# Patient Record
Sex: Male | Born: 1980 | Race: White | Hispanic: No | Marital: Single | Smoking: Current every day smoker
Health system: Southern US, Community
[De-identification: ages and names within clinical notes are randomized; demographics above are authoritative.]

---

## 2011-09-20 ENCOUNTER — Emergency Department (HOSPITAL_COMMUNITY)
Admission: EM | Admit: 2011-09-20 | Discharge: 2011-09-20 | Disposition: A | Discharge status: Home / Self Care | Payer: Self-pay | Attending: Emergency Medicine | Admitting: Emergency Medicine

## 2011-09-20 ENCOUNTER — Emergency Department (HOSPITAL_COMMUNITY): Payer: Self-pay

## 2011-09-20 DIAGNOSIS — S0230XA Fracture of orbital floor, unspecified side, initial encounter for closed fracture: Secondary | ICD-10-CM | POA: Insufficient documentation

## 2011-09-20 DIAGNOSIS — S0510XA Contusion of eyeball and orbital tissues, unspecified eye, initial encounter: Secondary | ICD-10-CM | POA: Insufficient documentation

## 2011-09-20 DIAGNOSIS — S0003XA Contusion of scalp, initial encounter: Secondary | ICD-10-CM | POA: Insufficient documentation

## 2011-09-20 DIAGNOSIS — H1130 Conjunctival hemorrhage, unspecified eye: Secondary | ICD-10-CM | POA: Insufficient documentation

## 2011-09-20 DIAGNOSIS — R51 Headache: Secondary | ICD-10-CM | POA: Insufficient documentation

## 2011-09-20 DIAGNOSIS — H5789 Other specified disorders of eye and adnexa: Secondary | ICD-10-CM | POA: Insufficient documentation

## 2011-09-20 DIAGNOSIS — H5710 Ocular pain, unspecified eye: Secondary | ICD-10-CM | POA: Insufficient documentation

## 2011-09-20 DIAGNOSIS — S02109A Fracture of base of skull, unspecified side, initial encounter for closed fracture: Secondary | ICD-10-CM | POA: Insufficient documentation

## 2011-09-20 DIAGNOSIS — S20229A Contusion of unspecified back wall of thorax, initial encounter: Secondary | ICD-10-CM | POA: Insufficient documentation

## 2011-09-20 DIAGNOSIS — IMO0002 Reserved for concepts with insufficient information to code with codable children: Secondary | ICD-10-CM | POA: Insufficient documentation
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
## 2014-05-25 ENCOUNTER — Encounter (HOSPITAL_COMMUNITY): Admission: EM | Disposition: A | Discharge status: Rehabilitation Facility | Payer: Self-pay | Source: Home / Self Care

## 2014-05-25 ENCOUNTER — Emergency Department (HOSPITAL_COMMUNITY): Payer: No Typology Code available for payment source

## 2014-05-25 ENCOUNTER — Encounter (HOSPITAL_COMMUNITY): Payer: Self-pay | Admitting: Emergency Medicine

## 2014-05-25 ENCOUNTER — Inpatient Hospital Stay (HOSPITAL_COMMUNITY): Payer: No Typology Code available for payment source

## 2014-05-25 ENCOUNTER — Inpatient Hospital Stay (HOSPITAL_COMMUNITY): Payer: No Typology Code available for payment source | Admitting: Anesthesiology

## 2014-05-25 ENCOUNTER — Encounter (HOSPITAL_COMMUNITY): Payer: No Typology Code available for payment source | Admitting: Anesthesiology

## 2014-05-25 ENCOUNTER — Inpatient Hospital Stay (HOSPITAL_COMMUNITY)
Admission: EM | Admit: 2014-05-25 | Discharge: 2014-06-01 | DRG: 464 | Disposition: A | Discharge status: Rehabilitation Facility | Payer: No Typology Code available for payment source | Attending: General Surgery | Admitting: General Surgery

## 2014-05-25 DIAGNOSIS — S060X0A Concussion without loss of consciousness, initial encounter: Secondary | ICD-10-CM | POA: Diagnosis present

## 2014-05-25 DIAGNOSIS — S01409A Unspecified open wound of unspecified cheek and temporomandibular area, initial encounter: Secondary | ICD-10-CM | POA: Diagnosis present

## 2014-05-25 DIAGNOSIS — S91009A Unspecified open wound, unspecified ankle, initial encounter: Secondary | ICD-10-CM

## 2014-05-25 DIAGNOSIS — R000 Tachycardia, unspecified: Secondary | ICD-10-CM | POA: Diagnosis not present

## 2014-05-25 DIAGNOSIS — S0530XA Ocular laceration without prolapse or loss of intraocular tissue, unspecified eye, initial encounter: Secondary | ICD-10-CM

## 2014-05-25 DIAGNOSIS — S81009A Unspecified open wound, unspecified knee, initial encounter: Secondary | ICD-10-CM | POA: Diagnosis present

## 2014-05-25 DIAGNOSIS — M949 Disorder of cartilage, unspecified: Secondary | ICD-10-CM

## 2014-05-25 DIAGNOSIS — S32409A Unspecified fracture of unspecified acetabulum, initial encounter for closed fracture: Principal | ICD-10-CM | POA: Diagnosis present

## 2014-05-25 DIAGNOSIS — S32402A Unspecified fracture of left acetabulum, initial encounter for closed fracture: Secondary | ICD-10-CM

## 2014-05-25 DIAGNOSIS — S0520XA Ocular laceration and rupture with prolapse or loss of intraocular tissue, unspecified eye, initial encounter: Secondary | ICD-10-CM | POA: Diagnosis present

## 2014-05-25 DIAGNOSIS — S060XAA Concussion with loss of consciousness status unknown, initial encounter: Secondary | ICD-10-CM

## 2014-05-25 DIAGNOSIS — S0181XA Laceration without foreign body of other part of head, initial encounter: Secondary | ICD-10-CM

## 2014-05-25 DIAGNOSIS — S81809A Unspecified open wound, unspecified lower leg, initial encounter: Secondary | ICD-10-CM

## 2014-05-25 DIAGNOSIS — F17200 Nicotine dependence, unspecified, uncomplicated: Secondary | ICD-10-CM | POA: Diagnosis present

## 2014-05-25 DIAGNOSIS — E349 Endocrine disorder, unspecified: Secondary | ICD-10-CM | POA: Diagnosis present

## 2014-05-25 DIAGNOSIS — S0121XA Laceration without foreign body of nose, initial encounter: Secondary | ICD-10-CM

## 2014-05-25 DIAGNOSIS — S42001A Fracture of unspecified part of right clavicle, initial encounter for closed fracture: Secondary | ICD-10-CM

## 2014-05-25 DIAGNOSIS — S0180XA Unspecified open wound of other part of head, initial encounter: Secondary | ICD-10-CM | POA: Diagnosis present

## 2014-05-25 DIAGNOSIS — Z1881 Retained glass fragments: Secondary | ICD-10-CM

## 2014-05-25 DIAGNOSIS — T07XXXA Unspecified multiple injuries, initial encounter: Secondary | ICD-10-CM | POA: Diagnosis present

## 2014-05-25 DIAGNOSIS — S42009A Fracture of unspecified part of unspecified clavicle, initial encounter for closed fracture: Secondary | ICD-10-CM | POA: Diagnosis present

## 2014-05-25 DIAGNOSIS — D62 Acute posthemorrhagic anemia: Secondary | ICD-10-CM | POA: Diagnosis not present

## 2014-05-25 DIAGNOSIS — Y92410 Unspecified street and highway as the place of occurrence of the external cause: Secondary | ICD-10-CM

## 2014-05-25 DIAGNOSIS — R339 Retention of urine, unspecified: Secondary | ICD-10-CM | POA: Diagnosis not present

## 2014-05-25 DIAGNOSIS — IMO0002 Reserved for concepts with insufficient information to code with codable children: Secondary | ICD-10-CM | POA: Diagnosis present

## 2014-05-25 DIAGNOSIS — M899 Disorder of bone, unspecified: Secondary | ICD-10-CM | POA: Diagnosis present

## 2014-05-25 DIAGNOSIS — S0120XA Unspecified open wound of nose, initial encounter: Secondary | ICD-10-CM | POA: Diagnosis present

## 2014-05-25 DIAGNOSIS — H2100 Hyphema, unspecified eye: Secondary | ICD-10-CM | POA: Diagnosis present

## 2014-05-25 DIAGNOSIS — S060X9A Concussion with loss of consciousness of unspecified duration, initial encounter: Secondary | ICD-10-CM

## 2014-05-25 DIAGNOSIS — S51809A Unspecified open wound of unspecified forearm, initial encounter: Secondary | ICD-10-CM | POA: Diagnosis present

## 2014-05-25 DIAGNOSIS — T2200XA Burn of unspecified degree of shoulder and upper limb, except wrist and hand, unspecified site, initial encounter: Secondary | ICD-10-CM

## 2014-05-25 DIAGNOSIS — F1210 Cannabis abuse, uncomplicated: Secondary | ICD-10-CM | POA: Diagnosis present

## 2014-05-25 HISTORY — PX: DEBRIDEMENT AND CLOSURE WOUND: SHX5614

## 2014-05-25 HISTORY — PX: RUPTURED GLOBE EXPLORATION AND REPAIR: SHX2366

## 2014-05-25 LAB — COMPREHENSIVE METABOLIC PANEL
ALBUMIN: 3.9 g/dL (ref 3.5–5.2)
ALT: 54 U/L — ABNORMAL HIGH (ref 0–53)
AST: 55 U/L — AB (ref 0–37)
Alkaline Phosphatase: 58 U/L (ref 39–117)
BILIRUBIN TOTAL: 0.3 mg/dL (ref 0.3–1.2)
BUN: 12 mg/dL (ref 6–23)
CHLORIDE: 106 meq/L (ref 96–112)
CO2: 23 mEq/L (ref 19–32)
CREATININE: 1.09 mg/dL (ref 0.50–1.35)
Calcium: 9.7 mg/dL (ref 8.4–10.5)
GFR calc Af Amer: >90 mL/min (ref >90)
GFR calc non Af Amer: 87 mL/min — ABNORMAL LOW (ref >90)
Glucose, Bld: 142 mg/dL — ABNORMAL HIGH (ref 70–99)
Potassium: 4.1 mEq/L (ref 3.7–5.3)
SODIUM: 143 meq/L (ref 137–147)
Total Protein: 7.2 g/dL (ref 6.0–8.3)

## 2014-05-25 LAB — PROTIME-INR
INR: 0.96 (ref 0.00–1.49)
Prothrombin Time: 12.6 seconds (ref 11.6–15.2)

## 2014-05-25 LAB — CBC
HCT: 46.6 % (ref 39.0–52.0)
HEMOGLOBIN: 15.8 g/dL (ref 13.0–17.0)
MCH: 31 pg (ref 26.0–34.0)
MCHC: 33.9 g/dL (ref 30.0–36.0)
MCV: 91.4 fL (ref 78.0–100.0)
PLATELETS: 306 10*3/uL (ref 150–400)
RBC: 5.1 MIL/uL (ref 4.22–5.81)
RDW: 13.2 % (ref 11.5–15.5)
WBC: 20.8 10*3/uL — ABNORMAL HIGH (ref 4.0–10.5)

## 2014-05-25 LAB — CDS SEROLOGY

## 2014-05-25 LAB — SAMPLE TO BLOOD BANK

## 2014-05-25 LAB — ETHANOL: Alcohol, Ethyl (B): <11 mg/dL (ref 0–11)

## 2014-05-25 LAB — LACTIC ACID, PLASMA: Lactic Acid, Venous: 2.6 mmol/L — ABNORMAL HIGH (ref 0.5–2.2)

## 2014-05-25 SURGERY — REPAIR, RUPTURE, GLOBE
Anesthesia: General | Site: Eye | Laterality: Right

## 2014-05-25 MED ORDER — LIDOCAINE HCL (CARDIAC) 20 MG/ML IV SOLN
INTRAVENOUS | Status: AC
  Filled 2014-05-25: qty 5

## 2014-05-25 MED ORDER — HYDROMORPHONE HCL PF 1 MG/ML IJ SOLN
INTRAMUSCULAR | Status: AC
  Filled 2014-05-25: qty 1

## 2014-05-25 MED ORDER — FENTANYL CITRATE 0.05 MG/ML IJ SOLN
INTRAMUSCULAR | Status: AC
  Filled 2014-05-25: qty 5

## 2014-05-25 MED ORDER — PROPOFOL 10 MG/ML IV BOLUS
200.0000 mg | Freq: Once | INTRAVENOUS | Status: AC
  Administered 2014-05-25: 40 mg via INTRAVENOUS
  Filled 2014-05-25: qty 20

## 2014-05-25 MED ORDER — FENTANYL CITRATE 0.05 MG/ML IJ SOLN
50.0000 ug | Freq: Once | INTRAMUSCULAR | Status: AC
  Administered 2014-05-25: 50 ug via INTRAVENOUS
  Filled 2014-05-25: qty 2

## 2014-05-25 MED ORDER — GLYCOPYRROLATE 0.2 MG/ML IJ SOLN
INTRAMUSCULAR | Status: DC | PRN
  Administered 2014-05-25: 0.6 mg via INTRAVENOUS

## 2014-05-25 MED ORDER — BACITRACIN-POLYMYXIN B 500-10000 UNIT/GM OP OINT
TOPICAL_OINTMENT | OPHTHALMIC | Status: AC
  Filled 2014-05-25: qty 3.5

## 2014-05-25 MED ORDER — ATROPINE SULFATE 1 % OP SOLN
OPHTHALMIC | Status: DC | PRN
  Administered 2014-05-25: 2 [drp] via OPHTHALMIC

## 2014-05-25 MED ORDER — HYDROMORPHONE HCL PF 1 MG/ML IJ SOLN
0.2500 mg | INTRAMUSCULAR | Status: DC | PRN
  Administered 2014-05-25 (×4): 0.5 mg via INTRAVENOUS

## 2014-05-25 MED ORDER — SODIUM CHLORIDE 0.9 % IJ SOLN
INTRAMUSCULAR | Status: AC
  Filled 2014-05-25: qty 10

## 2014-05-25 MED ORDER — BSS IO SOLN
INTRAOCULAR | Status: AC
  Filled 2014-05-25: qty 500

## 2014-05-25 MED ORDER — ONDANSETRON HCL 4 MG/2ML IJ SOLN
4.0000 mg | Freq: Once | INTRAMUSCULAR | Status: AC
  Administered 2014-05-25: 4 mg via INTRAVENOUS
  Filled 2014-05-25: qty 2

## 2014-05-25 MED ORDER — HYPROMELLOSE (GONIOSCOPIC) 2.5 % OP SOLN
OPHTHALMIC | Status: AC
  Filled 2014-05-25: qty 15

## 2014-05-25 MED ORDER — KETAMINE HCL 10 MG/ML IJ SOLN
2.0000 mg/kg | Freq: Once | INTRAMUSCULAR | Status: DC
  Filled 2014-05-25: qty 16.3

## 2014-05-25 MED ORDER — EPINEPHRINE HCL 1 MG/ML IJ SOLN
INTRAMUSCULAR | Status: AC
Start: 2014-05-25 — End: 2014-05-25
  Filled 2014-05-25: qty 1

## 2014-05-25 MED ORDER — IOHEXOL 300 MG/ML  SOLN
100.0000 mL | Freq: Once | INTRAMUSCULAR | Status: AC | PRN
  Administered 2014-05-25: 100 mL via INTRAVENOUS

## 2014-05-25 MED ORDER — IOHEXOL 300 MG/ML  SOLN: 100.0000 mL | Freq: Once | INTRAMUSCULAR | Status: AC | PRN

## 2014-05-25 MED ORDER — CEFAZOLIN SODIUM 1-5 GM-% IV SOLN
INTRAVENOUS | Status: AC
  Administered 2014-05-25: 1 g via INTRAVENOUS
  Filled 2014-05-25: qty 50

## 2014-05-25 MED ORDER — ROCURONIUM BROMIDE 100 MG/10ML IV SOLN
INTRAVENOUS | Status: DC | PRN
  Administered 2014-05-25 (×2): 10 mg via INTRAVENOUS
  Administered 2014-05-25: 50 mg via INTRAVENOUS

## 2014-05-25 MED ORDER — ONDANSETRON HCL 4 MG/2ML IJ SOLN: 4.0000 mg | Freq: Once | INTRAMUSCULAR | Status: DC | PRN

## 2014-05-25 MED ORDER — SODIUM HYALURONATE 10 MG/ML IO SOLN
INTRAOCULAR | Status: AC
  Filled 2014-05-25: qty 0.85

## 2014-05-25 MED ORDER — PHENYLEPHRINE HCL 2.5 % OP SOLN
OPHTHALMIC | Status: DC | PRN
  Administered 2014-05-25: 2 [drp] via OPHTHALMIC

## 2014-05-25 MED ORDER — LIDOCAINE HCL 4 % MT SOLN
OROMUCOSAL | Status: DC | PRN
  Administered 2014-05-25: 5 mL via TOPICAL

## 2014-05-25 MED ORDER — NEOSTIGMINE METHYLSULFATE 10 MG/10ML IV SOLN
INTRAVENOUS | Status: DC | PRN
  Administered 2014-05-25: 4 mg via INTRAVENOUS

## 2014-05-25 MED ORDER — PROPOFOL 10 MG/ML IV BOLUS
INTRAVENOUS | Status: AC
  Filled 2014-05-25: qty 20

## 2014-05-25 MED ORDER — TRIAMCINOLONE ACETONIDE 40 MG/ML IJ SUSP
INTRAMUSCULAR | Status: AC
  Filled 2014-05-25: qty 5

## 2014-05-25 MED ORDER — PHENYLEPHRINE HCL 10 MG/ML IJ SOLN
INTRAMUSCULAR | Status: DC | PRN
  Administered 2014-05-25 (×3): 80 ug via INTRAVENOUS

## 2014-05-25 MED ORDER — HYPROMELLOSE (GONIOSCOPIC) 2.5 % OP SOLN
OPHTHALMIC | Status: DC | PRN
  Administered 2014-05-25: 2 [drp] via OPHTHALMIC

## 2014-05-25 MED ORDER — ATROPINE SULFATE 1 % OP SOLN
1.0000 [drp] | OPHTHALMIC | Status: DC | PRN
  Filled 2014-05-25: qty 2

## 2014-05-25 MED ORDER — NA CHONDROIT SULF-NA HYALURON 40-30 MG/ML IO SOLN
INTRAOCULAR | Status: AC
Start: 2014-05-25 — End: 2014-05-25
  Filled 2014-05-25: qty 0.5

## 2014-05-25 MED ORDER — PROPOFOL 10 MG/ML IV BOLUS
INTRAVENOUS | Status: AC | PRN
  Administered 2014-05-25: 40 mg via INTRAVENOUS

## 2014-05-25 MED ORDER — PROPOFOL 10 MG/ML IV BOLUS
INTRAVENOUS | Status: DC | PRN
  Administered 2014-05-25: 200 mg via INTRAVENOUS

## 2014-05-25 MED ORDER — ACETAZOLAMIDE SODIUM 500 MG IJ SOLR
INTRAMUSCULAR | Status: DC | PRN
  Administered 2014-05-25: 500 mg via INTRAVENOUS

## 2014-05-25 MED ORDER — ONDANSETRON HCL 4 MG/2ML IJ SOLN
INTRAMUSCULAR | Status: DC | PRN
  Administered 2014-05-25: 4 mg via INTRAVENOUS

## 2014-05-25 MED ORDER — NEOSTIGMINE METHYLSULFATE 10 MG/10ML IV SOLN
INTRAVENOUS | Status: AC
  Filled 2014-05-25: qty 1

## 2014-05-25 MED ORDER — FENTANYL CITRATE 0.05 MG/ML IJ SOLN
100.0000 ug | Freq: Once | INTRAMUSCULAR | Status: AC
  Administered 2014-05-25: 100 ug via INTRAVENOUS
  Filled 2014-05-25: qty 2

## 2014-05-25 MED ORDER — ACETAZOLAMIDE SODIUM 500 MG IJ SOLR
INTRAMUSCULAR | Status: AC
  Filled 2014-05-25: qty 500

## 2014-05-25 MED ORDER — LIDOCAINE HCL 2 % IJ SOLN
INTRAMUSCULAR | Status: AC
  Filled 2014-05-25: qty 20

## 2014-05-25 MED ORDER — CEFAZOLIN SUBCONJUNCTIVAL INJECTION 100 MG/0.5 ML
INJECTION | SUBCONJUNCTIVAL | Status: DC | PRN
  Administered 2014-05-25: 200 mg via SUBCONJUNCTIVAL

## 2014-05-25 MED ORDER — HYDROMORPHONE HCL PF 1 MG/ML IJ SOLN
1.0000 mg | Freq: Once | INTRAMUSCULAR | Status: DC
  Filled 2014-05-25: qty 1

## 2014-05-25 MED ORDER — ROCURONIUM BROMIDE 50 MG/5ML IV SOLN
INTRAVENOUS | Status: AC
  Filled 2014-05-25: qty 1

## 2014-05-25 MED ORDER — HYDROMORPHONE HCL PF 1 MG/ML IJ SOLN
1.0000 mg | Freq: Once | INTRAMUSCULAR | Status: AC
  Administered 2014-05-25: 1 mg via INTRAVENOUS

## 2014-05-25 MED ORDER — TETRACAINE HCL 0.5 % OP SOLN
OPHTHALMIC | Status: AC
  Filled 2014-05-25: qty 2

## 2014-05-25 MED ORDER — SODIUM CHLORIDE 0.9 % IV BOLUS (SEPSIS)
1000.0000 mL | Freq: Once | INTRAVENOUS | Status: AC
  Administered 2014-05-25: 1000 mL via INTRAVENOUS

## 2014-05-25 MED ORDER — TETANUS-DIPHTH-ACELL PERTUSSIS 5-2.5-18.5 LF-MCG/0.5 IM SUSP
0.5000 mL | Freq: Once | INTRAMUSCULAR | Status: AC
  Administered 2014-05-25: 0.5 mL via INTRAMUSCULAR
  Filled 2014-05-25: qty 0.5

## 2014-05-25 MED ORDER — DEXAMETHASONE SODIUM PHOSPHATE 10 MG/ML IJ SOLN
INTRAMUSCULAR | Status: DC | PRN
  Administered 2014-05-25: 10 mg

## 2014-05-25 MED ORDER — CEFAZOLIN SUBCONJUNCTIVAL INJECTION 100 MG/0.5 ML
200.0000 mg | INJECTION | SUBCONJUNCTIVAL | Status: AC
  Filled 2014-05-25: qty 1

## 2014-05-25 MED ORDER — CEFAZOLIN SODIUM-DEXTROSE 2-3 GM-% IV SOLR
2.0000 g | Freq: Once | INTRAVENOUS | Status: AC
  Administered 2014-05-25: 2 g via INTRAVENOUS
  Filled 2014-05-25: qty 50

## 2014-05-25 MED ORDER — PHENYLEPHRINE HCL 2.5 % OP SOLN
1.0000 [drp] | OPHTHALMIC | Status: DC | PRN
Start: 2014-05-25 — End: 2014-05-25
  Filled 2014-05-25: qty 2

## 2014-05-25 MED ORDER — HYDROMORPHONE HCL PF 1 MG/ML IJ SOLN
1.0000 mg | Freq: Once | INTRAMUSCULAR | Status: AC
  Administered 2014-05-25: 1 mg via INTRAVENOUS
  Filled 2014-05-25: qty 1

## 2014-05-25 MED ORDER — ONDANSETRON HCL 4 MG/2ML IJ SOLN
INTRAMUSCULAR | Status: AC
  Filled 2014-05-25: qty 2

## 2014-05-25 MED ORDER — DEXAMETHASONE SODIUM PHOSPHATE 10 MG/ML IJ SOLN
INTRAMUSCULAR | Status: AC
  Filled 2014-05-25: qty 1

## 2014-05-25 MED ORDER — BSS IO SOLN
INTRAOCULAR | Status: AC
  Filled 2014-05-25: qty 15

## 2014-05-25 MED ORDER — SODIUM CHLORIDE 0.9 % IV SOLN
Freq: Once | INTRAVENOUS | Status: AC
  Administered 2014-05-25: 125 mL/h via INTRAVENOUS

## 2014-05-25 MED ORDER — BACITRACIN-POLYMYXIN B 500-10000 UNIT/GM OP OINT
TOPICAL_OINTMENT | OPHTHALMIC | Status: DC | PRN
  Administered 2014-05-25: 1 via OPHTHALMIC

## 2014-05-25 MED ORDER — BSS IO SOLN
INTRAOCULAR | Status: DC | PRN
  Administered 2014-05-25: 15 mL

## 2014-05-25 MED ORDER — FENTANYL CITRATE 0.05 MG/ML IJ SOLN
INTRAMUSCULAR | Status: AC
  Filled 2014-05-25: qty 2

## 2014-05-25 MED ORDER — FENTANYL CITRATE 0.05 MG/ML IJ SOLN
INTRAMUSCULAR | Status: DC | PRN
  Administered 2014-05-25 (×2): 100 ug via INTRAVENOUS
  Administered 2014-05-25: 50 ug via INTRAVENOUS

## 2014-05-25 MED ORDER — FENTANYL CITRATE 0.05 MG/ML IJ SOLN
100.0000 ug | Freq: Once | INTRAMUSCULAR | Status: AC
  Administered 2014-05-25: 100 ug via INTRAVENOUS

## 2014-05-25 MED ORDER — LACTATED RINGERS IV SOLN
INTRAVENOUS | Status: DC | PRN
  Administered 2014-05-25 (×2): via INTRAVENOUS

## 2014-05-25 MED ORDER — BUPIVACAINE HCL (PF) 0.75 % IJ SOLN
INTRAMUSCULAR | Status: AC
  Filled 2014-05-25: qty 10

## 2014-05-25 SURGICAL SUPPLY — 75 items
APL SRG 3 HI ABS STRL LF PLS (MISCELLANEOUS) ×2
APPLICATOR COTTON TIP 6IN STRL (MISCELLANEOUS) ×13 IMPLANT
APPLICATOR DR MATTHEWS STRL (MISCELLANEOUS) ×4 IMPLANT
BAG FLD CLT MN 6.25X3.5 (WOUND CARE) ×2
BAG MINI COLL DRAIN (WOUND CARE) ×4 IMPLANT
BLADE 10 SAFETY STRL DISP (BLADE) ×4 IMPLANT
BLADE EYE MINI 60D BEAVER (BLADE) IMPLANT
BLADE KERATOME 2.75 (BLADE) ×3 IMPLANT
BLADE KERATOME 2.75MM (BLADE) ×1
BLADE STAB KNIFE 15DEG (BLADE) IMPLANT
CANNULA ANTERIOR CHAMBER 27GA (MISCELLANEOUS) IMPLANT
CLOSURE STERI-STRIP 1/2X4 (GAUZE/BANDAGES/DRESSINGS) ×2
CLSR STERI-STRIP ANTIMIC 1/2X4 (GAUZE/BANDAGES/DRESSINGS) ×2 IMPLANT
CORDS BIPOLAR (ELECTRODE) ×3 IMPLANT
DRAPE OPHTHALMIC 77X100 STRL (CUSTOM PROCEDURE TRAY) ×4 IMPLANT
DRAPE POUCH INSTRU U-SHP 10X18 (DRAPES) ×4 IMPLANT
DRESSING TELFA 8X10 (GAUZE/BANDAGES/DRESSINGS) ×2 IMPLANT
DRSG TEGADERM 4X4.75 (GAUZE/BANDAGES/DRESSINGS) ×4 IMPLANT
DRSG TELFA 3X8 NADH (GAUZE/BANDAGES/DRESSINGS) ×8 IMPLANT
FILTER BLUE MILLIPORE (MISCELLANEOUS) IMPLANT
GLOVE SS BIOGEL STRL SZ 6.5 (GLOVE) ×2 IMPLANT
GLOVE SUPERSENSE BIOGEL SZ 6.5 (GLOVE) ×4
GOWN STRL REUS W/ TWL LRG LVL3 (GOWN DISPOSABLE) ×2 IMPLANT
GOWN STRL REUS W/ TWL XL LVL3 (GOWN DISPOSABLE) ×2 IMPLANT
GOWN STRL REUS W/TWL LRG LVL3 (GOWN DISPOSABLE) ×4
GOWN STRL REUS W/TWL XL LVL3 (GOWN DISPOSABLE) ×4
KIT BASIN OR (CUSTOM PROCEDURE TRAY) ×4 IMPLANT
KIT ROOM TURNOVER OR (KITS) IMPLANT
KNIFE GRIESHABER SHARP 2.5MM (MISCELLANEOUS) ×4 IMPLANT
MASK EYE SHIELD (GAUZE/BANDAGES/DRESSINGS) ×3 IMPLANT
NDL 18GX1X1/2 (RX/OR ONLY) (NEEDLE) IMPLANT
NDL 25GX 5/8IN NON SAFETY (NEEDLE) ×1 IMPLANT
NDL FILTER BLUNT 18X1 1/2 (NEEDLE) IMPLANT
NDL HYPO 30X.5 LL (NEEDLE) ×4 IMPLANT
NEEDLE 18GX1X1/2 (RX/OR ONLY) (NEEDLE) IMPLANT
NEEDLE 22X1 1/2 (OR ONLY) (NEEDLE) ×4 IMPLANT
NEEDLE 25GX 5/8IN NON SAFETY (NEEDLE) ×4 IMPLANT
NEEDLE FILTER BLUNT 18X 1/2SAF (NEEDLE)
NEEDLE FILTER BLUNT 18X1 1/2 (NEEDLE) IMPLANT
NEEDLE HYPO 30X.5 LL (NEEDLE) ×8 IMPLANT
NS IRRIG 1000ML POUR BTL (IV SOLUTION) ×4 IMPLANT
PACK CATARACT CUSTOM (CUSTOM PROCEDURE TRAY) ×4 IMPLANT
PACK CATARACT MCHSCP (PACKS) ×2 IMPLANT
PACK COMBINED CATERACT/VIT 23G (OPHTHALMIC RELATED) IMPLANT
PAD ARMBOARD 7.5X6 YLW CONV (MISCELLANEOUS) ×8 IMPLANT
PAD DRESSING TELFA 3X8 NADH (GAUZE/BANDAGES/DRESSINGS) IMPLANT
PAD EYE OVAL STERILE LF (GAUZE/BANDAGES/DRESSINGS) IMPLANT
PHACO TIP KELMAN 45DEG (TIP) ×4 IMPLANT
PROBE ANTERIOR 20G W/INFUS NDL (MISCELLANEOUS) IMPLANT
RING MALYGIN (MISCELLANEOUS) IMPLANT
ROLLS DENTAL (MISCELLANEOUS) IMPLANT
SHUTTLE MONARCH TYPE A (NEEDLE) ×4 IMPLANT
SOLUTION ANTI FOG 6CC (MISCELLANEOUS) ×4 IMPLANT
SPEAR EYE SURG WECK-CEL (MISCELLANEOUS) ×4 IMPLANT
SPONGE GAUZE 4X4 12PLY STER LF (GAUZE/BANDAGES/DRESSINGS) ×6 IMPLANT
SUT ETHILON 10-0 CS-B-6CS-B-6 (SUTURE) ×4
SUT ETHILON 5 0 P 3 18 (SUTURE)
SUT ETHILON 9 0 TG140 8 (SUTURE) IMPLANT
SUT NYLON ETHILON 5-0 P-3 1X18 (SUTURE) IMPLANT
SUT PLAIN 6 0 TG1408 (SUTURE) ×2 IMPLANT
SUT POLY NON ABSORB 10-0 8 STR (SUTURE) IMPLANT
SUT VICRYL 6 0 S 29 12 (SUTURE) IMPLANT
SUT VICRYL 7 0 TG140 8 (SUTURE) ×2 IMPLANT
SUTURE EHLN 10-0 CS-B-6CS-B-6 (SUTURE) ×1 IMPLANT
SYR 20CC LL (SYRINGE) IMPLANT
SYR 5ML LL (SYRINGE) IMPLANT
SYR TB 1ML LUER SLIP (SYRINGE) IMPLANT
SYRINGE 10CC LL (SYRINGE) IMPLANT
TAPE STRIPS DRAPE STRL (GAUZE/BANDAGES/DRESSINGS) ×2 IMPLANT
TAPE SURG TRANSPORE 1 IN (GAUZE/BANDAGES/DRESSINGS) IMPLANT
TAPE SURGICAL TRANSPORE 1 IN (GAUZE/BANDAGES/DRESSINGS) ×6
TOWEL OR 17X24 6PK STRL BLUE (TOWEL DISPOSABLE) ×8 IMPLANT
WATER STERILE IRR 1000ML POUR (IV SOLUTION) ×4 IMPLANT
WIPE INSTRUMENT ADHESIVE BACK (MISCELLANEOUS) ×2 IMPLANT
WIPE INSTRUMENT VISIWIPE 73X73 (MISCELLANEOUS) ×2 IMPLANT

## 2014-05-25 NOTE — ED Notes (Signed)
Dr. McLennon at the bedside.  
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
## 2014-05-25 NOTE — ED Notes (Signed)
Ortho MD at the bedside.

## 2014-05-25 NOTE — ED Notes (Signed)
Ophthalmologist at the bedside
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
## 2014-05-25 NOTE — ED Notes (Signed)
Pt returned from CT with this RN.  
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
## 2014-05-25 NOTE — Anesthesia Preprocedure Evaluation (Addendum)
Anesthesia Evaluation  Patient identified by MRN, date of birth, ID band Patient awake    Reviewed: Allergy & Precautions, H&P , NPO status , Patient's Chart, lab work & pertinent test results  Airway       Dental   Pulmonary Current Smoker,          Cardiovascular     Neuro/Psych    GI/Hepatic (+)     substance abuse  marijuana use,   Endo/Other    Renal/GU      Musculoskeletal   Abdominal   Peds  Hematology   Anesthesia Other Findings   Reproductive/Obstetrics                          Anesthesia Physical Anesthesia Plan  ASA: I and emergent  Anesthesia Plan: General   Post-op Pain Management:    Induction: Intravenous  Airway Management Planned: Oral ETT  Additional Equipment:   Intra-op Plan:   Post-operative Plan: Extubation in OR  Informed Consent: I have reviewed the patients History and Physical, chart, labs and discussed the procedure including the risks, benefits and alternatives for the proposed anesthesia with the patient or authorized representative who has indicated his/her understanding and acceptance.     Plan Discussed with:   Anesthesia Plan Comments:        Anesthesia Quick Evaluation

## 2014-05-25 NOTE — Op Note (Addendum)
  Phillip BargeBrian D [name] 05/25/2014 Ruptured Globe with prolapse of uveal tissue Hyphema  Corneal Laceration  Procedure:  Operative Eye:  right eye  Surgeon: Shade FloodGEIGER, Jabria Loos Estimated Blood Loss: minimal Specimens for Pathology:  None Complications: none  Genral endotracheal anesthesia was initiated without complication. The patient's skin injuries were examined. The patient had multiple facial lacerations, many of which were sutured in the ER. Blood was on the skin surface,  so the face and neck were cleaned with Betadine 10% solution. He was noted to have a normal pupil in both eyes, but he had clotted blood in the anterior chamber of the right eye. He was also noted to have a llamellar corneal laceration that did not penetrate the anterior chamber infero-medially. He has a chevron shaped conjunctival laceration at the nasal aspect which was associated with a scleral laceration overlying the ciliary body. The presumptive [facility] is the ciliary body injury.  The patient was prepared and draped in the usual manner for ocular surgery on the right eye. A Cook lid speculum was placed. The Wescott scissors were used to make a controlled conjunctival peritomy and expose the nasal sclera for exploration. There was modest, slow bleeding which was controlled by cautery of the sclera as well as the surface of the exposed ciliary body. No vitreous was prolapsed. The scleral wound was gradually brought back together with 7-0 Vicryl suture with successful, atraumatic reposit of the prolapsed ciliary body tissue. No retina was found exposed or in the surgical field. The globe was normal tension and no vitreous presented. After the sclera laceration was repaired, the conjunctiva was repaired with 6-0 Plain gut suture.  The llamellar corneal laceration was re-approximated with three interrupted 10-0 nylon sutures. There was no penetration into the anterior chamber. An acqueous sample was  withdrawn through the surgical limbus and placed in a pediatric blood culture bottle and submitted for culture.  A subconjunctival injection of Ancef 100 mg was placed in the temporal quadrant with the tip of the needle visualized. Dexamethansone 2mg  was also injected subconjunctivally in the inferior sub-conjunctival space with the tip of the needle visualized. Topical Bacitracin/Polymixin ointment was placed on his facial lacerations around the orbital area as well as on the ocular surface. An eye pad was placed along with a larger telfa pad so as to avoid tape adhering to the freshly sutured skin wounds.  He had multiple open skin lacerations most of which were deep. I cleaned all open wounds and closed them with steri-strips until the trauma service can finish appropriate exploration and repair. Three deep puncture wounds were on the right aspect of the neck with large linear abrasions of the chest. He has a large deep laceration down to bone on the right lower leg anteriorly and a deep left forearm laceration. All lacerations were closed with steri-strips and Telfa placed over the steri-strips  after the wounds were cleansed with Betadine. This was to clean and temporize until formal exploration and repair could be completed.  The patient was then extubated uneventfully and transferred with stable vital signs to the post-operative recovery area.  Shade FloodGEIGER, Hilarie Sinha, MD

## 2014-05-25 NOTE — Sedation Documentation (Signed)
Vital signs stable. 

## 2014-05-25 NOTE — Sedation Documentation (Signed)
Placed on NRB at 15 liters and [place] at 15 liters by Dr. Fonnie JarvisBednar.
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
## 2014-05-25 NOTE — ED Notes (Signed)
Continue to try to get scans completed. Pt not cooperating by being still. C/o left leg hurting. Repositioned multiple times. Attempting to complete scans at this time.

## 2014-05-25 NOTE — Transfer of Care (Signed)
Immediate Anesthesia Transfer of Care Note  Patient: Phillip [name]  Procedure(s) Performed: Procedure(s): REPAIR OF RUPTURED GLOBE WITH PROLAPSED UVEAL TISSUE RIGHT EYE (Right) SUPERFICIAL  CLOSURE OF WOUNDS AND DRESSING  ON RIGHT NECK, RIGHT LOWER LEG, LEFT FOREARM (Bilateral)  Patient Location: PACU  Anesthesia Type:General  Level of Consciousness: awake and sedated  Airway & Oxygen Therapy: Patient connected to face mask oxygen  Post-op Assessment: Report given to PACU RN and Post -op Vital signs reviewed and stable  Post vital signs: Reviewed and stable  Complications: No apparent anesthesia complications

## 2014-05-25 NOTE — ED Notes (Signed)
Attempted to contact pts girlfriend, [name] with no answer.
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
## 2014-05-25 NOTE — Anesthesia Procedure Notes (Signed)
Procedure Name: Intubation Date/Time: 05/25/2014 8:16 PM Performed by: Molli HazardGORDON, Tymon Nemetz M Pre-anesthesia Checklist: Patient identified, Emergency Drugs available, Suction available and Patient being monitored Patient Re-evaluated:Patient Re-evaluated prior to inductionOxygen Delivery Method: Circle system utilized Preoxygenation: Pre-oxygenation with 100% oxygen Intubation Type: IV induction and Cricoid Pressure applied Ventilation: Mask ventilation without difficulty Laryngoscope Size: Miller and 2 Grade View: Grade I Tube type: Oral Tube size: 8.0 mm Number of attempts: 1 Airway Equipment and Method: Stylet Placement Confirmation: ETT inserted through vocal cords under direct vision,  positive ETCO2 and breath sounds checked- equal and bilateral Secured at: 23 cm Tube secured with: Tape Dental Injury: Teeth and Oropharynx as per pre-operative assessment

## 2014-05-25 NOTE — Progress Notes (Signed)
Orthopedic Tech Progress Note Patient Details:  Phillip BargeBrian D [name] 1981-04-05 [id number]  Musculoskeletal Traction Type of Traction: Skeletal (Balanced Suspension) Traction Location: lle Traction Weight: 20 lbs    Nikki DomCrawford, Zhania Shaheen 05/25/2014, 7:53 PM
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
## 2014-05-25 NOTE — Sedation Documentation (Signed)
Pt moved from stretcher to hospital bed

## 2014-05-25 NOTE — OR Nursing (Signed)
Dr. Clarisa KindredGeiger cleaned the right neck wounds x 3 with betadine paint

## 2014-05-25 NOTE — ED Notes (Signed)
Pt taken to CT scan with this RN

## 2014-05-25 NOTE — Sedation Documentation (Signed)
NRB removed and O2 via [place] turned down to 3 liters.
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
## 2014-05-26 ENCOUNTER — Inpatient Hospital Stay (HOSPITAL_COMMUNITY): Payer: No Typology Code available for payment source

## 2014-05-26 ENCOUNTER — Inpatient Hospital Stay (HOSPITAL_COMMUNITY): Payer: No Typology Code available for payment source | Admitting: Anesthesiology

## 2014-05-26 ENCOUNTER — Encounter: Payer: Self-pay | Admitting: Ophthalmology

## 2014-05-26 ENCOUNTER — Encounter (HOSPITAL_COMMUNITY): Payer: Self-pay

## 2014-05-26 ENCOUNTER — Encounter (HOSPITAL_COMMUNITY): Admission: EM | Disposition: A | Discharge status: Rehabilitation Facility | Payer: Self-pay | Source: Home / Self Care

## 2014-05-26 ENCOUNTER — Encounter (HOSPITAL_COMMUNITY): Payer: No Typology Code available for payment source | Admitting: Anesthesiology

## 2014-05-26 DIAGNOSIS — S42009A Fracture of unspecified part of unspecified clavicle, initial encounter for closed fracture: Secondary | ICD-10-CM

## 2014-05-26 DIAGNOSIS — D62 Acute posthemorrhagic anemia: Secondary | ICD-10-CM

## 2014-05-26 DIAGNOSIS — S0530XA Ocular laceration without prolapse or loss of intraocular tissue, unspecified eye, initial encounter: Secondary | ICD-10-CM | POA: Diagnosis present

## 2014-05-26 HISTORY — PX: INCISION AND DRAINAGE OF WOUND: SHX1803

## 2014-05-26 HISTORY — PX: ORIF ACETABULAR FRACTURE: SHX5029

## 2014-05-26 LAB — CBC
HCT: 38.1 % — ABNORMAL LOW (ref 39.0–52.0)
HEMATOCRIT: 39.1 % (ref 39.0–52.0)
Hemoglobin: 12.8 g/dL — ABNORMAL LOW (ref 13.0–17.0)
Hemoglobin: 12.5 g/dL — ABNORMAL LOW (ref 13.0–17.0)
MCH: 30.5 pg (ref 26.0–34.0)
MCH: 30.5 pg (ref 26.0–34.0)
MCHC: 32.7 g/dL (ref 30.0–36.0)
MCHC: 32.8 g/dL (ref 30.0–36.0)
MCV: 93.1 fL (ref 78.0–100.0)
MCV: 92.9 fL (ref 78.0–100.0)
Platelets: 225 10*3/uL (ref 150–400)
Platelets: 212 10*3/uL (ref 150–400)
RBC: 4.2 MIL/uL — ABNORMAL LOW (ref 4.22–5.81)
RBC: 4.1 MIL/uL — AB (ref 4.22–5.81)
RDW: 13.6 % (ref 11.5–15.5)
RDW: 13.6 % (ref 11.5–15.5)
WBC: 11.3 10*3/uL — AB (ref 4.0–10.5)
WBC: 10.9 10*3/uL — AB (ref 4.0–10.5)

## 2014-05-26 LAB — MRSA PCR SCREENING: MRSA by PCR: NEGATIVE

## 2014-05-26 LAB — BASIC METABOLIC PANEL
BUN: 10 mg/dL (ref 6–23)
CO2: 23 mEq/L (ref 19–32)
Calcium: 8.5 mg/dL (ref 8.4–10.5)
Chloride: 104 mEq/L (ref 96–112)
Creatinine, Ser: 1.05 mg/dL (ref 0.50–1.35)
Glucose, Bld: 108 mg/dL — ABNORMAL HIGH (ref 70–99)
POTASSIUM: 4.5 meq/L (ref 3.7–5.3)
Sodium: 140 mEq/L (ref 137–147)

## 2014-05-26 LAB — LACTIC ACID, PLASMA: LACTIC ACID, VENOUS: 1.1 mmol/L (ref 0.5–2.2)

## 2014-05-26 SURGERY — OPEN REDUCTION INTERNAL FIXATION (ORIF) ACETABULAR FRACTURE
Anesthesia: General | Site: Leg Lower | Laterality: Right

## 2014-05-26 MED ORDER — PROPOFOL 10 MG/ML IV BOLUS
INTRAVENOUS | Status: DC | PRN
  Administered 2014-05-26: 180 mg via INTRAVENOUS

## 2014-05-26 MED ORDER — OXYCODONE HCL 5 MG/5ML PO SOLN: 5.0000 mg | Freq: Once | ORAL | Status: AC | PRN

## 2014-05-26 MED ORDER — GLYCOPYRROLATE 0.2 MG/ML IJ SOLN
INTRAMUSCULAR | Status: AC
  Filled 2014-05-26: qty 3

## 2014-05-26 MED ORDER — CEFAZOLIN SODIUM-DEXTROSE 2-3 GM-% IV SOLR
2.0000 g | Freq: Once | INTRAVENOUS | Status: AC
  Administered 2014-05-26: 2 g via INTRAVENOUS

## 2014-05-26 MED ORDER — ONDANSETRON HCL 4 MG/2ML IJ SOLN
INTRAMUSCULAR | Status: DC | PRN
  Administered 2014-05-26: 4 mg via INTRAVENOUS

## 2014-05-26 MED ORDER — LACTATED RINGERS IV SOLN
INTRAVENOUS | Status: DC | PRN
  Administered 2014-05-26 (×2): via INTRAVENOUS

## 2014-05-26 MED ORDER — MIDAZOLAM HCL 2 MG/2ML IJ SOLN
INTRAMUSCULAR | Status: AC
  Filled 2014-05-26: qty 2

## 2014-05-26 MED ORDER — NEOSTIGMINE METHYLSULFATE 10 MG/10ML IV SOLN
INTRAVENOUS | Status: AC
  Filled 2014-05-26: qty 1

## 2014-05-26 MED ORDER — ROCURONIUM BROMIDE 100 MG/10ML IV SOLN
INTRAVENOUS | Status: DC | PRN
  Administered 2014-05-26: 20 mg via INTRAVENOUS
  Administered 2014-05-26: 10 mg via INTRAVENOUS
  Administered 2014-05-26: 20 mg via INTRAVENOUS
  Administered 2014-05-26: 10 mg via INTRAVENOUS
  Administered 2014-05-26: 20 mg via INTRAVENOUS
  Administered 2014-05-26: 50 mg via INTRAVENOUS
  Administered 2014-05-26: 10 mg via INTRAVENOUS

## 2014-05-26 MED ORDER — LIDOCAINE HCL (CARDIAC) 20 MG/ML IV SOLN
INTRAVENOUS | Status: DC | PRN
  Administered 2014-05-26: 80 mg via INTRAVENOUS

## 2014-05-26 MED ORDER — PANTOPRAZOLE SODIUM 40 MG PO TBEC
40.0000 mg | DELAYED_RELEASE_TABLET | Freq: Every day | ORAL | Status: DC
  Administered 2014-05-27 – 2014-05-31 (×5): 40 mg via ORAL
  Filled 2014-05-26 (×5): qty 1

## 2014-05-26 MED ORDER — PROPOFOL 10 MG/ML IV BOLUS
INTRAVENOUS | Status: AC
  Filled 2014-05-26: qty 20

## 2014-05-26 MED ORDER — OXYCODONE HCL 5 MG PO TABS
5.0000 mg | ORAL_TABLET | Freq: Once | ORAL | Status: AC | PRN
  Administered 2014-05-26: 5 mg via ORAL

## 2014-05-26 MED ORDER — BACITRACIN ZINC 500 UNIT/GM EX OINT
TOPICAL_OINTMENT | Freq: Two times a day (BID) | CUTANEOUS | Status: DC
  Administered 2014-05-26 – 2014-05-27 (×2): 1 via TOPICAL
  Administered 2014-05-27 – 2014-05-28 (×3): via TOPICAL
  Administered 2014-05-29: 1 via TOPICAL
  Administered 2014-05-29 – 2014-06-01 (×6): via TOPICAL
  Filled 2014-05-26: qty 15

## 2014-05-26 MED ORDER — ONDANSETRON HCL 4 MG PO TABS
4.0000 mg | ORAL_TABLET | Freq: Four times a day (QID) | ORAL | Status: DC | PRN
Start: 2014-05-26 — End: 2014-06-01
  Administered 2014-05-29: 4 mg via ORAL

## 2014-05-26 MED ORDER — LACTATED RINGERS IV SOLN
INTRAVENOUS | Status: DC
  Administered 2014-05-26: 14:00:00 via INTRAVENOUS

## 2014-05-26 MED ORDER — HYDROMORPHONE HCL PF 1 MG/ML IJ SOLN
INTRAMUSCULAR | Status: AC
  Filled 2014-05-26: qty 1

## 2014-05-26 MED ORDER — PANTOPRAZOLE SODIUM 40 MG IV SOLR
40.0000 mg | Freq: Every day | INTRAVENOUS | Status: DC
  Administered 2014-05-26: 40 mg via INTRAVENOUS
  Filled 2014-05-26 (×3): qty 40

## 2014-05-26 MED ORDER — GLYCOPYRROLATE 0.2 MG/ML IJ SOLN
INTRAMUSCULAR | Status: DC | PRN
  Administered 2014-05-26: 0.6 mg via INTRAVENOUS

## 2014-05-26 MED ORDER — NEOSTIGMINE METHYLSULFATE 10 MG/10ML IV SOLN
INTRAVENOUS | Status: DC | PRN
  Administered 2014-05-26: 4 mg via INTRAVENOUS

## 2014-05-26 MED ORDER — WHITE PETROLATUM GEL
Status: AC
  Filled 2014-05-26: qty 5

## 2014-05-26 MED ORDER — ARTIFICIAL TEARS OP OINT
TOPICAL_OINTMENT | OPHTHALMIC | Status: DC | PRN
  Administered 2014-05-26: 1 via OPHTHALMIC

## 2014-05-26 MED ORDER — ROCURONIUM BROMIDE 50 MG/5ML IV SOLN
INTRAVENOUS | Status: AC
  Filled 2014-05-26: qty 1

## 2014-05-26 MED ORDER — HYDROMORPHONE HCL PF 1 MG/ML IJ SOLN
1.0000 mg | INTRAMUSCULAR | Status: DC | PRN
  Administered 2014-05-26 – 2014-05-29 (×8): 1 mg via INTRAVENOUS
  Filled 2014-05-26 (×10): qty 1

## 2014-05-26 MED ORDER — PHENYLEPHRINE HCL 10 MG/ML IJ SOLN
INTRAMUSCULAR | Status: DC | PRN
  Administered 2014-05-26: 40 ug via INTRAVENOUS
  Administered 2014-05-26 (×2): 80 ug via INTRAVENOUS
  Administered 2014-05-26: 40 ug via INTRAVENOUS

## 2014-05-26 MED ORDER — METHOCARBAMOL 1000 MG/10ML IJ SOLN
1000.0000 mg | Freq: Four times a day (QID) | INTRAVENOUS | Status: DC | PRN
  Filled 2014-05-26: qty 10

## 2014-05-26 MED ORDER — MIDAZOLAM HCL 5 MG/5ML IJ SOLN
INTRAMUSCULAR | Status: DC | PRN
  Administered 2014-05-26: 2 mg via INTRAVENOUS

## 2014-05-26 MED ORDER — HYDROMORPHONE HCL PF 1 MG/ML IJ SOLN
0.5000 mg | INTRAMUSCULAR | Status: DC | PRN
Start: 2014-05-26 — End: 2014-05-26
  Administered 2014-05-26 (×2): 0.5 mg via INTRAVENOUS
  Filled 2014-05-26: qty 1

## 2014-05-26 MED ORDER — HYDROMORPHONE HCL PF 1 MG/ML IJ SOLN
1.0000 mg | Freq: Once | INTRAMUSCULAR | Status: AC
  Administered 2014-05-26: 1 mg via INTRAVENOUS

## 2014-05-26 MED ORDER — 0.9 % SODIUM CHLORIDE (POUR BTL) OPTIME
TOPICAL | Status: DC | PRN
  Administered 2014-05-26: 1000 mL

## 2014-05-26 MED ORDER — DOCUSATE SODIUM 100 MG PO CAPS
100.0000 mg | ORAL_CAPSULE | Freq: Two times a day (BID) | ORAL | Status: DC
  Administered 2014-05-27 – 2014-06-01 (×10): 100 mg via ORAL
  Filled 2014-05-26 (×13): qty 1

## 2014-05-26 MED ORDER — METOCLOPRAMIDE HCL 5 MG/ML IJ SOLN
INTRAMUSCULAR | Status: DC | PRN
  Administered 2014-05-26: 10 mg via INTRAVENOUS

## 2014-05-26 MED ORDER — FENTANYL CITRATE 0.05 MG/ML IJ SOLN
INTRAMUSCULAR | Status: AC
  Filled 2014-05-26: qty 5

## 2014-05-26 MED ORDER — METOCLOPRAMIDE HCL 5 MG/ML IJ SOLN: 10.0000 mg | Freq: Once | INTRAMUSCULAR | Status: DC | PRN

## 2014-05-26 MED ORDER — CEFAZOLIN SODIUM 1-5 GM-% IV SOLN
1.0000 g | Freq: Three times a day (TID) | INTRAVENOUS | Status: AC
  Administered 2014-05-26 – 2014-05-27 (×3): 1 g via INTRAVENOUS
  Filled 2014-05-26 (×3): qty 50

## 2014-05-26 MED ORDER — POTASSIUM CHLORIDE IN NACL 20-0.45 MEQ/L-% IV SOLN
INTRAVENOUS | Status: DC
  Administered 2014-05-26 – 2014-05-28 (×5): via INTRAVENOUS
  Filled 2014-05-26 (×10): qty 1000

## 2014-05-26 MED ORDER — LIDOCAINE HCL (CARDIAC) 20 MG/ML IV SOLN
INTRAVENOUS | Status: AC
  Filled 2014-05-26: qty 5

## 2014-05-26 MED ORDER — POLYETHYLENE GLYCOL 3350 17 G PO PACK
17.0000 g | PACK | Freq: Every day | ORAL | Status: DC
  Administered 2014-05-27 – 2014-05-30 (×3): 17 g via ORAL
  Filled 2014-05-26 (×7): qty 1

## 2014-05-26 MED ORDER — ONDANSETRON HCL 4 MG/2ML IJ SOLN
INTRAMUSCULAR | Status: AC
  Filled 2014-05-26: qty 2

## 2014-05-26 MED ORDER — METOCLOPRAMIDE HCL 5 MG/ML IJ SOLN
INTRAMUSCULAR | Status: AC
  Filled 2014-05-26: qty 2

## 2014-05-26 MED ORDER — OXYCODONE HCL 5 MG PO TABS
ORAL_TABLET | ORAL | Status: AC
  Filled 2014-05-26: qty 1

## 2014-05-26 MED ORDER — ENOXAPARIN SODIUM 40 MG/0.4ML ~~LOC~~ SOLN
40.0000 mg | SUBCUTANEOUS | Status: DC
  Administered 2014-05-26 – 2014-06-01 (×7): 40 mg via SUBCUTANEOUS
  Filled 2014-05-26 (×8): qty 0.4

## 2014-05-26 MED ORDER — SILVER SULFADIAZINE 1 % EX CREA
TOPICAL_CREAM | Freq: Two times a day (BID) | CUTANEOUS | Status: DC
  Administered 2014-05-27: 1 via TOPICAL
  Administered 2014-05-27 – 2014-05-28 (×3): via TOPICAL
  Administered 2014-05-29: 1 via TOPICAL
  Administered 2014-05-29 – 2014-06-01 (×6): via TOPICAL
  Filled 2014-05-26: qty 85

## 2014-05-26 MED ORDER — CEFAZOLIN SODIUM-DEXTROSE 2-3 GM-% IV SOLR
INTRAVENOUS | Status: AC
  Filled 2014-05-26: qty 50

## 2014-05-26 MED ORDER — FENTANYL CITRATE 0.05 MG/ML IJ SOLN
INTRAMUSCULAR | Status: DC | PRN
  Administered 2014-05-26: 50 ug via INTRAVENOUS
  Administered 2014-05-26: 100 ug via INTRAVENOUS
  Administered 2014-05-26 (×3): 50 ug via INTRAVENOUS
  Administered 2014-05-26: 100 ug via INTRAVENOUS
  Administered 2014-05-26 (×2): 50 ug via INTRAVENOUS

## 2014-05-26 MED ORDER — OXYCODONE HCL 5 MG PO TABS
5.0000 mg | ORAL_TABLET | ORAL | Status: DC | PRN
  Administered 2014-05-26: 15 mg via ORAL
  Administered 2014-05-27: 5 mg via ORAL
  Administered 2014-05-27 – 2014-05-28 (×2): 15 mg via ORAL
  Administered 2014-05-28: 10 mg via ORAL
  Administered 2014-05-29 (×4): 15 mg via ORAL
  Administered 2014-05-30: 10 mg via ORAL
  Administered 2014-05-30 (×2): 15 mg via ORAL
  Administered 2014-05-31 (×2): 10 mg via ORAL
  Administered 2014-05-31 – 2014-06-01 (×3): 15 mg via ORAL
  Filled 2014-05-26: qty 2
  Filled 2014-05-26 (×3): qty 3
  Filled 2014-05-26: qty 2
  Filled 2014-05-26 (×2): qty 3
  Filled 2014-05-26: qty 2
  Filled 2014-05-26: qty 1
  Filled 2014-05-26: qty 3
  Filled 2014-05-26: qty 1
  Filled 2014-05-26 (×4): qty 3
  Filled 2014-05-26: qty 2
  Filled 2014-05-26: qty 1
  Filled 2014-05-26 (×3): qty 3

## 2014-05-26 MED ORDER — HYDROMORPHONE HCL PF 1 MG/ML IJ SOLN
0.2500 mg | INTRAMUSCULAR | Status: DC | PRN
Start: 2014-05-26 — End: 2014-05-26
  Administered 2014-05-26 (×4): 0.5 mg via INTRAVENOUS

## 2014-05-26 MED ORDER — ONDANSETRON HCL 4 MG/2ML IJ SOLN
4.0000 mg | Freq: Four times a day (QID) | INTRAMUSCULAR | Status: DC | PRN
  Filled 2014-05-26: qty 2

## 2014-05-26 MED ORDER — PHENYLEPHRINE 40 MCG/ML (10ML) SYRINGE FOR IV PUSH (FOR BLOOD PRESSURE SUPPORT)
PREFILLED_SYRINGE | INTRAVENOUS | Status: AC
  Filled 2014-05-26: qty 10

## 2014-05-26 SURGICAL SUPPLY — 84 items
APPLIER CLIP 11 MED OPEN (CLIP)
APR CLP MED 11 20 MLT OPN (CLIP)
BIT DRILL AO MATTA 2.5MX230M (BIT) ×1 IMPLANT
BIT DRILL TWST MATTA 3.5MX195M (BIT) IMPLANT
BIT DRILL TWST MATTA 4.5MX6.5M (BIT) IMPLANT
BLADE SURG ROTATE 9660 (MISCELLANEOUS) IMPLANT
BONE CHIP PRESERV 20CC (Bone Implant) ×3 IMPLANT
BRUSH SCRUB DISP (MISCELLANEOUS) ×6 IMPLANT
CLIP APPLIE 11 MED OPEN (CLIP) IMPLANT
COVER SURGICAL LIGHT HANDLE (MISCELLANEOUS) ×3 IMPLANT
DRAIN CHANNEL 10F 3/8 F FF (DRAIN) IMPLANT
DRAIN CHANNEL 15F RND FF W/TCR (WOUND CARE) IMPLANT
DRAPE C-ARM 42X72 X-RAY (DRAPES) IMPLANT
DRAPE C-ARMOR (DRAPES) ×3 IMPLANT
DRAPE INCISE IOBAN 66X45 STRL (DRAPES) IMPLANT
DRAPE INCISE IOBAN 85X60 (DRAPES) ×6 IMPLANT
DRAPE ORTHO SPLIT 77X108 STRL (DRAPES) ×6
DRAPE SURG ORHT 6 SPLT 77X108 (DRAPES) ×4 IMPLANT
DRAPE U-SHAPE 47X51 STRL (DRAPES) ×3 IMPLANT
DRILL BIT AO MATTA 2.5MX230M (BIT) ×3
DRILL TWIST AO MATTA 3.5MX195M (BIT) ×3
DRILL TWIST MATTA 4.5MX6.5M (BIT) ×3
DRSG ADAPTIC 3X8 NADH LF (GAUZE/BANDAGES/DRESSINGS) ×3 IMPLANT
DRSG MEPILEX BORDER 4X12 (GAUZE/BANDAGES/DRESSINGS) ×2 IMPLANT
DRSG PAD ABDOMINAL 8X10 ST (GAUZE/BANDAGES/DRESSINGS) ×6 IMPLANT
ELECT BLADE 6.5 EXT (BLADE) IMPLANT
ELECT REM PT RETURN 9FT ADLT (ELECTROSURGICAL) ×3
ELECTRODE REM PT RTRN 9FT ADLT (ELECTROSURGICAL) ×2 IMPLANT
EVACUATOR 1/8 PVC DRAIN (DRAIN) IMPLANT
EVACUATOR SILICONE 100CC (DRAIN) ×3 IMPLANT
GAUZE SPONGE 4X4 16PLY XRAY LF (GAUZE/BANDAGES/DRESSINGS) ×3 IMPLANT
GLOVE BIO SURGEON STRL SZ7.5 (GLOVE) ×3 IMPLANT
GLOVE BIO SURGEON STRL SZ8 (GLOVE) ×3 IMPLANT
GLOVE BIOGEL PI IND STRL 7.5 (GLOVE) ×2 IMPLANT
GLOVE BIOGEL PI IND STRL 8 (GLOVE) ×2 IMPLANT
GLOVE BIOGEL PI INDICATOR 7.5 (GLOVE) ×1
GLOVE BIOGEL PI INDICATOR 8 (GLOVE) ×1
GOWN STRL REUS W/ TWL LRG LVL3 (GOWN DISPOSABLE) ×4 IMPLANT
GOWN STRL REUS W/ TWL XL LVL3 (GOWN DISPOSABLE) ×2 IMPLANT
GOWN STRL REUS W/TWL 2XL LVL3 (GOWN DISPOSABLE) ×3 IMPLANT
GOWN STRL REUS W/TWL LRG LVL3 (GOWN DISPOSABLE) ×6
GOWN STRL REUS W/TWL XL LVL3 (GOWN DISPOSABLE) ×3
HANDPIECE INTERPULSE COAX TIP (DISPOSABLE)
KIT BASIN OR (CUSTOM PROCEDURE TRAY) ×3 IMPLANT
KIT ROOM TURNOVER OR (KITS) ×3 IMPLANT
LIGHT ORTHO (MISCELLANEOUS) IMPLANT
LOOP VESSEL MAXI BLUE (MISCELLANEOUS) IMPLANT
MANIFOLD NEPTUNE II (INSTRUMENTS) ×3 IMPLANT
NDL MAYO TROCAR (NEEDLE) ×2 IMPLANT
NEEDLE MAYO TROCAR (NEEDLE) ×3 IMPLANT
NS IRRIG 1000ML POUR BTL (IV SOLUTION) ×3 IMPLANT
PACK TOTAL JOINT (CUSTOM PROCEDURE TRAY) ×3 IMPLANT
PAD ARMBOARD 7.5X6 YLW CONV (MISCELLANEOUS) ×6 IMPLANT
PILLOW ABDUCTION HIP (SOFTGOODS) ×1 IMPLANT
PIN REDUCTION 6.0 (PIN) ×1 IMPLANT
PLATE ACET STRT 46.5M 4H (Plate) ×1 IMPLANT
PLATE ACET STRT 94.5M 8H (Plate) ×1 IMPLANT
RETRIEVER SUT HEWSON (MISCELLANEOUS) IMPLANT
SCREW CORTEX ST MATTA 3.5X24 (Screw) ×1 IMPLANT
SCREW CORTEX ST MATTA 3.5X30MM (Screw) ×1 IMPLANT
SCREW CORTEX ST MATTA 3.5X32MM (Screw) ×1 IMPLANT
SCREW CORTEX ST MATTA 3.5X34MM (Screw) ×1 IMPLANT
SCREW CORTEX ST MATTA 3.5X36MM (Screw) ×4 IMPLANT
SCREW CORTEX ST MATTA 3.5X40MM (Screw) ×2 IMPLANT
SCREW CORTEX ST MATTA 3.5X45MM (Screw) ×1 IMPLANT
SET HNDPC FAN SPRY TIP SCT (DISPOSABLE) IMPLANT
SPONGE GAUZE 4X4 12PLY (GAUZE/BANDAGES/DRESSINGS) ×3 IMPLANT
SPONGE LAP 18X18 X RAY DECT (DISPOSABLE) ×9 IMPLANT
STAPLER VISISTAT 35W (STAPLE) ×3 IMPLANT
STRIP CLOSURE SKIN 1/2X4 (GAUZE/BANDAGES/DRESSINGS) ×3 IMPLANT
SUCTION FRAZIER TIP 10 FR DISP (SUCTIONS) ×3 IMPLANT
SUT FIBERWIRE #2 38 T-5 BLUE (SUTURE)
SUT VIC AB 0 CT1 27 (SUTURE) ×3
SUT VIC AB 0 CT1 27XBRD ANBCTR (SUTURE) ×2 IMPLANT
SUT VIC AB 1 CT1 18XCR BRD 8 (SUTURE) ×2 IMPLANT
SUT VIC AB 1 CT1 8-18 (SUTURE) ×3
SUT VIC AB 2-0 CT1 27 (SUTURE) ×3
SUT VIC AB 2-0 CT1 TAPERPNT 27 (SUTURE) ×2 IMPLANT
SUTURE FIBERWR #2 38 T-5 BLUE (SUTURE) IMPLANT
SYR BULB IRRIGATION 50ML (SYRINGE) ×1 IMPLANT
TOWEL OR 17X24 6PK STRL BLUE (TOWEL DISPOSABLE) ×3 IMPLANT
TOWEL OR 17X26 10 PK STRL BLUE (TOWEL DISPOSABLE) ×6 IMPLANT
TRAY FOLEY CATH 16FRSI W/METER (SET/KITS/TRAYS/PACK) IMPLANT
WATER STERILE IRR 1000ML POUR (IV SOLUTION) IMPLANT

## 2014-05-26 NOTE — Progress Notes (Signed)
Appreciate Dr. Magdalene PatriciaHandy's assistance. Expected L hip pain. Ornery. Patient examined and I agree with the assessment and plan  Violeta GelinasBurke Thompson, MD, MPH, FACS Trauma: [phone] General Surgery: [phone]  05/26/2014 10:16 AM

## 2014-05-26 NOTE — Progress Notes (Signed)
Care of pt assumed by MA Shaver RN from P. Lopez RN 

## 2014-05-26 NOTE — ED Provider Notes (Signed)
I have personally seen and examined the patient.  I have discussed the plan of care with the resident.  I have reviewed the documentation on PMH/FH/Soc. History.  I have reviewed the documentation of the resident and agree.  I was not present for laceration repair    Joya Gaskinsonald W Tamaj Jurgens, MD 05/26/14 [phone]
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
## 2014-05-26 NOTE — Anesthesia Postprocedure Evaluation (Signed)
  Anesthesia Post-op Note  Patient: Phillip [name]  Procedure(s) Performed: Procedure(s): OPEN REDUCTION INTERNAL FIXATION (ORIF) ACETABULAR FRACTURE (Left) IRRIGATION AND DEBRIDEMENT WOUND WITH WOUND CLOSURE (Right)  Patient Location: PACU  Anesthesia Type:General  Level of Consciousness: awake  Airway and Oxygen Therapy: Patient Spontanous Breathing  Post-op Pain: mild  Post-op Assessment: Post-op Vital signs reviewed  Post-op Vital Signs: Reviewed  Last Vitals:  Filed Vitals:   05/26/14 1806  BP:   Pulse:   Temp: 36.8 C  Resp:     Complications: No apparent anesthesia complications
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
## 2014-05-26 NOTE — Progress Notes (Signed)
Was called to pt BS secondary to pts urinary retention post op.  16Fr foley coude catheter place,small amount of resistance likely related to the patient tensing up.  Large amount of urine into foley, no blood. Pt tolerated proc well.

## 2014-05-26 NOTE — Transfer of Care (Signed)
Immediate Anesthesia Transfer of Care Note  Patient: Phillip [name]  Procedure(s) Performed: Procedure(s): OPEN REDUCTION INTERNAL FIXATION (ORIF) ACETABULAR FRACTURE (Left) IRRIGATION AND DEBRIDEMENT WOUND WITH WOUND CLOSURE (Right)  Patient Location: PACU  Anesthesia Type:General  Level of Consciousness: awake, alert  and oriented  Airway & Oxygen Therapy: Patient Spontanous Breathing and Patient connected to nasal cannula oxygen  Post-op Assessment: Report given to PACU RN and Post -op Vital signs reviewed and stable  Post vital signs: Reviewed and stable  Complications: No apparent anesthesia complications
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
## 2014-05-26 NOTE — Treatment Plan (Signed)
Discussed case with Dr. Carola FrostHandy and he has agreed to take over care.  Mayra ReelN. Michael Xu, MD [facility] [phone] 7:49 AM

## 2014-05-26 NOTE — Anesthesia Preprocedure Evaluation (Signed)
Anesthesia Evaluation  Patient identified by MRN, date of birth, ID band Patient awake    Reviewed: Allergy & Precautions, H&P , NPO status , Patient's Chart, lab work & pertinent test results, reviewed documented beta blocker date and time   Airway Mallampati: II TM Distance: >3 FB Neck ROM: full    Dental   Pulmonary Current Smoker,  breath sounds clear to auscultation        Cardiovascular negative cardio ROS  Rhythm:regular     Neuro/Psych  Neuromuscular disease negative neurological ROS  negative psych ROS   GI/Hepatic negative GI ROS, (+)     substance abuse  marijuana use,   Endo/Other  negative endocrine ROS  Renal/GU negative Renal ROS  negative genitourinary   Musculoskeletal   Abdominal   Peds  Hematology negative hematology ROS (+)   Anesthesia Other Findings See surgeon's H&P   Reproductive/Obstetrics negative OB ROS                           Anesthesia Physical Anesthesia Plan  ASA: III  Anesthesia Plan: General   Post-op Pain Management:    Induction:   Airway Management Planned: Oral ETT  Additional Equipment:   Intra-op Plan:   Post-operative Plan: Extubation in OR  Informed Consent: I have reviewed the patients History and Physical, chart, labs and discussed the procedure including the risks, benefits and alternatives for the proposed anesthesia with the patient or authorized representative who has indicated his/her understanding and acceptance.   Dental Advisory Given  Plan Discussed with: CRNA and Surgeon  Anesthesia Plan Comments:         Anesthesia Quick Evaluation

## 2014-05-26 NOTE — Consult Note (Signed)
I have seen and examined the patient. I agree with the findings above.  I discussed with the patient the risks and benefits of surgery for repair of left acetabulum, including the possibility of infection, nerve injury, vessel injury, instability, heterotopic bone, wound breakdown, arthritis, symptomatic hardware, DVT/ PE, loss of motion, and need for further surgery among others.  He acknowledged these risks and wished to proceed.   Budd PalmerHANDY,MICHAEL H, MD 05/26/2014 1:47 PM

## 2014-05-27 ENCOUNTER — Telehealth: Payer: Self-pay | Admitting: Oncology

## 2014-05-27 ENCOUNTER — Inpatient Hospital Stay (HOSPITAL_COMMUNITY): Payer: No Typology Code available for payment source

## 2014-05-27 ENCOUNTER — Encounter: Payer: Self-pay | Admitting: Ophthalmology

## 2014-05-27 ENCOUNTER — Encounter (HOSPITAL_COMMUNITY): Payer: Self-pay | Admitting: Physical Medicine and Rehabilitation

## 2014-05-27 DIAGNOSIS — S069X9A Unspecified intracranial injury with loss of consciousness of unspecified duration, initial encounter: Secondary | ICD-10-CM

## 2014-05-27 DIAGNOSIS — S069XAA Unspecified intracranial injury with loss of consciousness status unknown, initial encounter: Secondary | ICD-10-CM

## 2014-05-27 DIAGNOSIS — R000 Tachycardia, unspecified: Secondary | ICD-10-CM

## 2014-05-27 DIAGNOSIS — S42009A Fracture of unspecified part of unspecified clavicle, initial encounter for closed fracture: Secondary | ICD-10-CM

## 2014-05-27 DIAGNOSIS — S32409A Unspecified fracture of unspecified acetabulum, initial encounter for closed fracture: Secondary | ICD-10-CM

## 2014-05-27 LAB — CBC
HCT: 33.5 % — ABNORMAL LOW (ref 39.0–52.0)
HEMOGLOBIN: 11.2 g/dL — AB (ref 13.0–17.0)
MCH: 30.6 pg (ref 26.0–34.0)
MCHC: 33.4 g/dL (ref 30.0–36.0)
MCV: 91.5 fL (ref 78.0–100.0)
Platelets: 219 10*3/uL (ref 150–400)
RBC: 3.66 MIL/uL — AB (ref 4.22–5.81)
RDW: 13.4 % (ref 11.5–15.5)
WBC: 11.4 10*3/uL — AB (ref 4.0–10.5)

## 2014-05-27 MED ORDER — GATIFLOXACIN 0.5 % OP SOLN
1.0000 [drp] | Freq: Four times a day (QID) | OPHTHALMIC | Status: DC
  Administered 2014-05-27 – 2014-06-01 (×19): 1 [drp] via OPHTHALMIC
  Filled 2014-05-27: qty 2.5

## 2014-05-27 MED ORDER — METOPROLOL TARTRATE 12.5 MG HALF TABLET: 12.5000 mg | ORAL_TABLET | Freq: Two times a day (BID) | ORAL | Status: DC

## 2014-05-27 MED ORDER — PREDNISOLONE ACETATE 1 % OP SUSP
1.0000 [drp] | Freq: Four times a day (QID) | OPHTHALMIC | Status: DC
  Administered 2014-05-27 – 2014-06-01 (×19): 1 [drp] via OPHTHALMIC
  Filled 2014-05-27: qty 1

## 2014-05-27 MED ORDER — DOUBLE ANTIBIOTIC 500-10000 UNIT/GM EX OINT
TOPICAL_OINTMENT | Freq: Four times a day (QID) | CUTANEOUS | Status: DC
  Filled 2014-05-27 (×31): qty 1

## 2014-05-27 MED ORDER — BACITRACIN-POLYMYXIN B 500-10000 UNIT/GM OP OINT
TOPICAL_OINTMENT | Freq: Four times a day (QID) | OPHTHALMIC | Status: DC
  Filled 2014-05-27: qty 3.5

## 2014-05-27 MED ORDER — BACITRACIN-POLYMYXIN B 500-10000 UNIT/GM OP OINT
TOPICAL_OINTMENT | Freq: Four times a day (QID) | OPHTHALMIC | Status: DC
  Administered 2014-05-27: 22:00:00 via OPHTHALMIC
  Administered 2014-05-27 (×2): 1 via OPHTHALMIC
  Administered 2014-05-28 (×2): via OPHTHALMIC
  Administered 2014-05-28: 1 via OPHTHALMIC
  Administered 2014-05-29 – 2014-06-01 (×12): via OPHTHALMIC
  Filled 2014-05-27 (×2): qty 3.5

## 2014-05-27 MED ORDER — METOPROLOL TARTRATE 12.5 MG HALF TABLET
12.5000 mg | ORAL_TABLET | Freq: Two times a day (BID) | ORAL | Status: DC
  Administered 2014-05-27 (×2): 12.5 mg via ORAL
  Filled 2014-05-27 (×4): qty 1
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
## 2014-05-27 NOTE — Progress Notes (Signed)
Pt. Refused to go to xray tonight and asked if he could go in the morning because by then he will be "rested up."  I explained the importance of the xrays to the pt. And pt. Agrees to go tomorrow morning. Vanice Sarahhompson, Taylor L

## 2014-05-27 NOTE — Telephone Encounter (Signed)
Called Phillip [name]'s nurse, Tammy SoursGreg, RN to see if patient can have radiation today.  Per Tammy SoursGreg, patient will probably not be willing to transfer to another bed to [facility] as he has said he "will get up when he is ready."  Notified Heather, Radiation Therapist who will plan treatment for tomorrow.
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
## 2014-05-27 NOTE — Progress Notes (Signed)
Rehab Admissions Coordinator Note:  Patient was screened by Trish MageLogue, Eugenia M for appropriateness for an Inpatient Acute Rehab Consult.  At this time, we are recommending Inpatient Rehab consult.  Trish MageLogue, Eugenia M 05/27/2014, 9:24 AM  I can be reached at [phone].

## 2014-05-27 NOTE — Plan of Care (Signed)
Problem: Phase I Progression Outcomes Goal: Pre op hemodynamically stable Outcome: Progressing ST: metoprolol added by MD
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
## 2014-05-27 NOTE — Progress Notes (Signed)
Chaplain responded to spiritual consult concerning pt "wanting to become minister." Chaplain asked about this and he stated, "I could talk your ear off for days about that," but said he needed to sleep at the moment. Chaplain available for follow-up if necessary.
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
## 2014-05-27 NOTE — Telephone Encounter (Signed)
Phillip Andreaalled Greg, RN to have [facility] arranged for tomorrow to have [name] here by 11:15 am.  Phillip [name] said [name] is moving to another floor and he will let the new nurse know to arrange [facility] for tomorrow.

## 2014-05-27 NOTE — Progress Notes (Signed)
Pt has refused imaging and treatment throughout the day. He re-emphasizes that he will do it when he is ready which he indicates will be "later". [facility]
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
## 2014-05-28 ENCOUNTER — Telehealth: Payer: Self-pay | Admitting: *Deleted

## 2014-05-28 ENCOUNTER — Encounter: Payer: Self-pay | Admitting: Radiation Oncology

## 2014-05-28 ENCOUNTER — Inpatient Hospital Stay (HOSPITAL_COMMUNITY): Payer: No Typology Code available for payment source

## 2014-05-28 ENCOUNTER — Ambulatory Visit
Admit: 2014-05-28 | Discharge: 2014-05-28 | Disposition: A | Discharge status: Home / Self Care | Payer: No Typology Code available for payment source | Attending: Radiation Oncology | Admitting: Radiation Oncology

## 2014-05-28 ENCOUNTER — Ambulatory Visit: Payer: No Typology Code available for payment source | Admitting: Radiation Oncology

## 2014-05-28 VITALS — BP 140/78 | HR 121 | Temp 98.8°F | Resp 20

## 2014-05-28 DIAGNOSIS — S32409A Unspecified fracture of unspecified acetabulum, initial encounter for closed fracture: Secondary | ICD-10-CM

## 2014-05-28 LAB — URINALYSIS, ROUTINE W REFLEX MICROSCOPIC
BILIRUBIN URINE: NEGATIVE
GLUCOSE, UA: NEGATIVE mg/dL
Ketones, ur: 15 mg/dL — AB
Leukocytes, UA: NEGATIVE
Nitrite: NEGATIVE
Protein, ur: 30 mg/dL — AB
Specific Gravity, Urine: 1.02 (ref 1.005–1.030)
Urobilinogen, UA: 0.2 mg/dL (ref 0.0–1.0)
pH: 6.5 (ref 5.0–8.0)

## 2014-05-28 LAB — CBC
HCT: 30 % — ABNORMAL LOW (ref 39.0–52.0)
HEMOGLOBIN: 9.9 g/dL — AB (ref 13.0–17.0)
MCH: 30.1 pg (ref 26.0–34.0)
MCHC: 33 g/dL (ref 30.0–36.0)
MCV: 91.2 fL (ref 78.0–100.0)
Platelets: 188 10*3/uL (ref 150–400)
RBC: 3.29 MIL/uL — ABNORMAL LOW (ref 4.22–5.81)
RDW: 13.2 % (ref 11.5–15.5)
WBC: 8.6 10*3/uL (ref 4.0–10.5)

## 2014-05-28 LAB — BASIC METABOLIC PANEL
BUN: 8 mg/dL (ref 6–23)
CO2: 23 mEq/L (ref 19–32)
Calcium: 8.1 mg/dL — ABNORMAL LOW (ref 8.4–10.5)
Chloride: 98 mEq/L (ref 96–112)
Creatinine, Ser: 0.68 mg/dL (ref 0.50–1.35)
GFR calc Af Amer: >90 mL/min (ref >90)
GLUCOSE: 103 mg/dL — AB (ref 70–99)
Potassium: 3.9 mEq/L (ref 3.7–5.3)
SODIUM: 134 meq/L — AB (ref 137–147)

## 2014-05-28 LAB — URINE MICROSCOPIC-ADD ON

## 2014-05-28 LAB — RAPID URINE DRUG SCREEN, HOSP PERFORMED
AMPHETAMINES: NOT DETECTED
BARBITURATES: NOT DETECTED
Benzodiazepines: NOT DETECTED
Cocaine: NOT DETECTED
OPIATES: NOT DETECTED
Tetrahydrocannabinol: POSITIVE — AB

## 2014-05-28 MED ORDER — METOPROLOL TARTRATE 25 MG PO TABS
25.0000 mg | ORAL_TABLET | Freq: Two times a day (BID) | ORAL | Status: DC
  Administered 2014-05-28 – 2014-06-01 (×9): 25 mg via ORAL
  Filled 2014-05-28 (×11): qty 1

## 2014-05-28 NOTE — Progress Notes (Signed)
Catheter removed.  Pt tolerated well.  Will continue to monitor. Vanice Sarahhompson, Taylor L

## 2014-05-28 NOTE — Telephone Encounter (Signed)
called 6N floor, spoke with Taylor,RN, she has arranged [facility] to transport patient  At 1045 am  [facility] Radiation dept, she is medicating patient for pain now, thanked her will in basket MD 8:59 AM

## 2014-05-28 NOTE — Progress Notes (Signed)
I contacted pt's girlfriend, [name], by phone. They have been together for 3 months. As per SW, she has 4 children, newly expectant mother, and cares for her 34 yo grandmother. She and I question her ability to care for pt after a inpt rehab stay of 2 to 3 weeks. She will discuss with pt's family tonight and we will discuss further tomorrow. Pt may need SNF rehab for a prolonged recovery period. I will follow up tomorrow. [phone]

## 2014-05-28 NOTE — Progress Notes (Signed)
Sitting up on side of bed with hterapies. For XRT today. Advance diet. Patient examined and I agree with the assessment and plan  Violeta GelinasBurke Thompson, MD, MPH, FACS Trauma: [phone] General Surgery: [phone]  05/28/2014 10:26 AM

## 2014-05-28 NOTE — Progress Notes (Signed)
[facility].  Tele removed.  MD aware.  VSS. Vanice Sarahhompson, Taylor L
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
## 2014-05-28 NOTE — Progress Notes (Signed)
12:35pmMr. Phillip [name] here for his heterotopic bone irradiation.  He is presently on the treatment table and note agitation whenever he is touched or moved.  Immobilization device in place between legs.  Dressing on left hip dry and intact.  IV site in left antecubital region is intact.  Information repeated several times to ensure his understanding in regards to why he is receiving treatment,  the goal of treatment, in addition to, explaining how his body will be supported when moved from stretcher to treatment table and vice versa.  7 staff members assisted with transfer.     1:28pm Mr Phillip [name] tolerated his treatment without any pain.  Very cooperative. He die not require any pain medications.  His IV site remains intact and secured with a large Op-site dressing.  Site without any selling , nor redness and he denies any discomfort at the insertion site.  Foley catheter intact and draining freely.  VSS revealed elevated Pulse of 121 which is his baseline on review of his prior vitals.  Left foot warm to touch with a strong pedal pulse.  Transported back to [facility] at 1:25pm ~.

## 2014-05-28 NOTE — Progress Notes (Signed)
I have reviewed and discussed in detail with Mr. Paul the patient's presentation, examination findings, and I formulated the plan outlined above.  Envi Eagleson, MD [facility] [phone] (p)   

## 2014-05-28 NOTE — Progress Notes (Signed)
[facility] Therapy Treatment Record Phone [phone]   Radiation Therapy was administered to Phillip [name] on: 05/28/2014  1:36 PM and was treatment # 1 out of a planned course of 1 treatments.

## 2014-05-28 NOTE — Progress Notes (Signed)
Report called to [facility]. Phillip [name], Phillip [name]

## 2014-05-28 NOTE — Telephone Encounter (Signed)
error 
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
## 2014-05-28 NOTE — Progress Notes (Signed)
I saw and examined the patient with Mr. Paul, communicating the findings and plan noted above.  Michael Handy, MD [facility] [phone] (p)  
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
## 2014-05-28 NOTE — Progress Notes (Signed)
Name: Phillip [name]   MRN: [id number]  Date:  05/28/2014  DOB: 10-Dec-1981  Status:inpatient   DIAGNOSIS: Left acetabular fracture  CONSENT VERIFIED: yes   SET UP: Patient is setup supine   NARRATIVE:  Pt [name] was brought to the linear accelerator.  Identity was confirmed.  All relevant records and images related to the planned course of therapy were reviewed.  Then, the patient was positioned in a stable reproducible clinical set-up for radiation therapy.  CT images were obtained.  An isocenter was placed. Skin markings were placed and open field formed to the area to be treated in the left acetabulum  The radiation prescription was entered and confirmed. The patient was discharged in stable condition and tolerated simulation well.

## 2014-05-28 NOTE — Progress Notes (Signed)
I will follow pt's participation with therapy to assist with determining disposition. Noted cognitive deficits. [phone]
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
## 2014-05-29 ENCOUNTER — Encounter: Payer: Self-pay | Admitting: Ophthalmology

## 2014-05-29 LAB — CBC
HCT: 29.7 % — ABNORMAL LOW (ref 39.0–52.0)
Hemoglobin: 10.1 g/dL — ABNORMAL LOW (ref 13.0–17.0)
MCH: 30.3 pg (ref 26.0–34.0)
MCHC: 34 g/dL (ref 30.0–36.0)
MCV: 89.2 fL (ref 78.0–100.0)
PLATELETS: 266 10*3/uL (ref 150–400)
RBC: 3.33 MIL/uL — AB (ref 4.22–5.81)
RDW: 12.8 % (ref 11.5–15.5)
WBC: 9.6 10*3/uL (ref 4.0–10.5)

## 2014-05-29 LAB — TESTOSTERONE: Testosterone: 66 ng/dL — ABNORMAL LOW (ref 300–890)

## 2014-05-29 LAB — PREALBUMIN: Prealbumin: 9.3 mg/dL — ABNORMAL LOW (ref 17.0–34.0)

## 2014-05-29 MED ORDER — METHOCARBAMOL 500 MG PO TABS
500.0000 mg | ORAL_TABLET | Freq: Four times a day (QID) | ORAL | Status: DC
  Administered 2014-05-29 – 2014-06-01 (×13): 500 mg via ORAL
  Filled 2014-05-29 (×16): qty 1

## 2014-05-29 MED ORDER — ACETAMINOPHEN 500 MG PO TABS
1000.0000 mg | ORAL_TABLET | Freq: Three times a day (TID) | ORAL | Status: DC
  Administered 2014-05-29 – 2014-06-01 (×8): 1000 mg via ORAL
  Filled 2014-05-29 (×14): qty 2
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
## 2014-05-29 NOTE — Progress Notes (Unsigned)
Patient ID: Sherald BargeBrian D Eno, male   DOB: 02-Jan-1981, 33 y.o.   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 Vitreous hemorrhage right is clearing  VA: near card without correction   20/50!!!  Right eye   20/20 left eye  Ocular pressure deferred   Lid/Lash: edema much improved Conj: moderate chemosis, improving clinically Cor: Chevron shaped partial thickness laceration, suceured with 3 stitches, good corneal clarity at bedside AC: deep, heme resorbed Iris: normal Lens: clear  Vitreous: blot clot on retina, moderate vitreous heme resorbing Retina: hazy view , but retina is attached  Impression:    Doing well post surgical repair of ruptured globe. Retina remains stable thusfar. Hemorrhage is clearing nicely.   Encouraged by his visual progress given his Laceration. Corneal clarity is excellent.  Plan:    Continue on Prednisolone Acetate 1% alone 1 gtt  OD QID at tarnsfer to rehab.    Continue Polymyxin/Bacitracin Ophthalmic Ointment OD QID, prn foreign body sensation.    His girlfriend has my office card and will schedule an appointment to come in for evaluation and corneal suture removal once he is more mobile and able to sit more comfortably.  He is advised to call my office number if he is having any changes in his vision or increased discomfort. (24/7)  Shade FloodGEIGER, GREER, MD [phone]

## 2014-05-29 NOTE — Clinical Social Work Note (Signed)
Clinical Social Worker continuing to follow patient and family for support and discharge planning needs.  CSW met with patient and patient girlfriend at bedside who state that they have discussed plans and feel that they can make adequate home arrangements following inpatient rehab - CSW notified inpatient rehab admissions coordinator.  CSW explained that if plans were to change and patient were to need SNF placement at discharge, it would likely be out of the county due to lack of coverage.  Patient and patient girlfriend agreeable to [facility] only if needed.  Patient states that he pays $44 out of each paycheck for medical insurance coverage.  CSW spoke with patient employer who provided member ID number [id number] for [employer] coverage.  Patient employer clearly states that this coverage only covers doctor's appointments and routine visits, no hospital coverage.  Patient employer states that patient had the opportunity to apply for [place] but declined since it was not a requirement with employment.  CSW updated inpatient rehab admissions coordinator.  Patient remains self pay for this hospitalization and rehab needs.  CSW remains available for support as needed.  Barbette Or, [place]

## 2014-05-29 NOTE — Progress Notes (Signed)
A bit better spirits. Feels like he will have BM after eating. Therapies and possible CIR next week. Watch large abrasion L thigh, local care. Patient examined and I agree with the assessment and plan  Violeta GelinasBurke Clorene Nerio, MD, MPH, FACS Trauma: [phone] General Surgery: [phone]  05/29/2014 9:55 AM
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
## 2014-05-29 NOTE — Progress Notes (Signed)
Orthopaedic Trauma Service follow up note  Noted some reports that pt is to be NWB on R upper extremity  His clavicle fx involves the medial end and is very stable Ok to [place] thru R upper extremity and Range of motion  As tolerated Sling to R arm for comfort only, does not need to wear  If there are any questions over the weekend please call me directly   Mearl LatinKeith W. Anyelin Mogle, PA-C [facility] [phone] (P) 05/29/2014 5:30 PM

## 2014-05-29 NOTE — Progress Notes (Signed)
Orthopedic Tech Progress Note Patient Details:  Phillip BargeBrian D [name] Apr 13, 1981 [id number]  Ortho Devices Type of Ortho Device: Sling immobilizer Ortho Device/Splint Interventions: Application   Shawnie PonsCammer, Jennifer Carol 05/29/2014, 12:10 PM

## 2014-05-29 NOTE — Progress Notes (Signed)
Orthopedic Tech Progress Note Patient Details:  Phillip [name] 1981/07/15 [id number]  Patient ID: Phillip [name], male   DOB: 1981/07/15, 33 y.o.   MRN: [id number]   Shawnie PonsCammer, Jennifer Carol 05/29/2014, 9:45 AMTrapeze bar
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
## 2014-05-29 NOTE — Progress Notes (Signed)
Pt found in shower by NT. Girlfriend is assisting pt. I have explained importance of maintaining TDWB to left leg and NWB to right arm. Pt continues to bear weight on both extremities. Ashok NorrisEmina Riebock, PA notified.
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
## 2014-05-29 NOTE — Progress Notes (Signed)
I met with pt and his girlfriend at bedside. I discussed inpt rehab rigor and the need for him to demonstrate that he can do the intensity of therapy required. He does seem cognitively clearer today, but lacks awareness of his severity of deficits as well as his WB restrictions. He perceives the restrictions are there due to his level of pain tolerance, not the injury itself.Girlfriend can provide 24/7 assist and they prefer inpt rehab admission rather than SNF. I will follow up on Monday. I did contact acute therapy department to request pt be seen one day this weekend for progression of his activity. I have discussed with SW. [phone]

## 2014-05-30 LAB — CBC
HEMATOCRIT: 27.4 % — AB (ref 39.0–52.0)
Hemoglobin: 9.2 g/dL — ABNORMAL LOW (ref 13.0–17.0)
MCH: 29.9 pg (ref 26.0–34.0)
MCHC: 33.6 g/dL (ref 30.0–36.0)
MCV: 89 fL (ref 78.0–100.0)
Platelets: 231 10*3/uL (ref 150–400)
RBC: 3.08 MIL/uL — ABNORMAL LOW (ref 4.22–5.81)
RDW: 12.9 % (ref 11.5–15.5)
WBC: 7.3 10*3/uL (ref 4.0–10.5)

## 2014-05-30 LAB — VITAMIN D 25 HYDROXY (VIT D DEFICIENCY, FRACTURES): Vit D, 25-Hydroxy: 49 ng/mL (ref 30–89)
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
## 2014-05-31 LAB — CULTURE, BODY FLUID W GRAM STAIN -BOTTLE: Culture: NO GROWTH

## 2014-05-31 LAB — CULTURE, BODY FLUID-BOTTLE
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
## 2014-06-01 ENCOUNTER — Inpatient Hospital Stay (HOSPITAL_COMMUNITY)
Admission: RE | Admit: 2014-06-01 | Discharge: 2014-06-09 | DRG: 945 | Disposition: A | Discharge status: Home / Self Care | Payer: No Typology Code available for payment source | Source: Intra-hospital | Attending: Physical Medicine & Rehabilitation | Admitting: Physical Medicine & Rehabilitation

## 2014-06-01 DIAGNOSIS — Z87820 Personal history of traumatic brain injury: Secondary | ICD-10-CM

## 2014-06-01 DIAGNOSIS — D62 Acute posthemorrhagic anemia: Secondary | ICD-10-CM | POA: Diagnosis not present

## 2014-06-01 DIAGNOSIS — S32409A Unspecified fracture of unspecified acetabulum, initial encounter for closed fracture: Secondary | ICD-10-CM | POA: Diagnosis present

## 2014-06-01 DIAGNOSIS — T07XXXA Unspecified multiple injuries, initial encounter: Secondary | ICD-10-CM

## 2014-06-01 DIAGNOSIS — S069X9A Unspecified intracranial injury with loss of consciousness of unspecified duration, initial encounter: Secondary | ICD-10-CM

## 2014-06-01 DIAGNOSIS — S42009A Fracture of unspecified part of unspecified clavicle, initial encounter for closed fracture: Secondary | ICD-10-CM | POA: Diagnosis present

## 2014-06-01 DIAGNOSIS — F1210 Cannabis abuse, uncomplicated: Secondary | ICD-10-CM | POA: Diagnosis present

## 2014-06-01 DIAGNOSIS — S069XAA Unspecified intracranial injury with loss of consciousness status unknown, initial encounter: Secondary | ICD-10-CM

## 2014-06-01 DIAGNOSIS — Z5189 Encounter for other specified aftercare: Principal | ICD-10-CM

## 2014-06-01 DIAGNOSIS — T2200XA Burn of unspecified degree of shoulder and upper limb, except wrist and hand, unspecified site, initial encounter: Secondary | ICD-10-CM

## 2014-06-01 DIAGNOSIS — F17200 Nicotine dependence, unspecified, uncomplicated: Secondary | ICD-10-CM | POA: Diagnosis present

## 2014-06-01 DIAGNOSIS — S73006A Unspecified dislocation of unspecified hip, initial encounter: Secondary | ICD-10-CM | POA: Diagnosis present

## 2014-06-01 DIAGNOSIS — E349 Endocrine disorder, unspecified: Secondary | ICD-10-CM | POA: Diagnosis present

## 2014-06-01 LAB — CREATININE, SERUM: CREATININE: 0.72 mg/dL (ref 0.50–1.35)

## 2014-06-01 LAB — SEX HORMONE BINDING GLOBULIN: Sex Hormone Binding: 21 nmol/L (ref 13–71)

## 2014-06-01 LAB — CBC
HEMATOCRIT: 27.6 % — AB (ref 39.0–52.0)
HEMOGLOBIN: 9.1 g/dL — AB (ref 13.0–17.0)
MCH: 29.8 pg (ref 26.0–34.0)
MCHC: 33 g/dL (ref 30.0–36.0)
MCV: 90.5 fL (ref 78.0–100.0)
Platelets: 404 10*3/uL — ABNORMAL HIGH (ref 150–400)
RBC: 3.05 MIL/uL — AB (ref 4.22–5.81)
RDW: 12.9 % (ref 11.5–15.5)
WBC: 8.3 10*3/uL (ref 4.0–10.5)

## 2014-06-01 LAB — TESTOSTERONE, FREE: Testosterone, Free: 15.2 pg/mL — ABNORMAL LOW (ref 47.0–244.0)

## 2014-06-01 LAB — TESTOSTERONE, % FREE: TESTOSTERONE-% FREE: 2.3 % — AB (ref 1.6–2.9)

## 2014-06-01 MED ORDER — DOCUSATE SODIUM 100 MG PO CAPS
100.0000 mg | ORAL_CAPSULE | Freq: Two times a day (BID) | ORAL | Status: DC
  Administered 2014-06-01 – 2014-06-09 (×15): 100 mg via ORAL
  Filled 2014-06-01 (×18): qty 1

## 2014-06-01 MED ORDER — ONDANSETRON HCL 4 MG/2ML IJ SOLN: 4.0000 mg | Freq: Four times a day (QID) | INTRAMUSCULAR | Status: DC | PRN

## 2014-06-01 MED ORDER — ACETAMINOPHEN 325 MG PO TABS
325.0000 mg | ORAL_TABLET | ORAL | Status: DC | PRN
  Administered 2014-06-01 – 2014-06-03 (×3): 650 mg via ORAL
  Filled 2014-06-01 (×4): qty 2

## 2014-06-01 MED ORDER — ENOXAPARIN SODIUM 40 MG/0.4ML ~~LOC~~ SOLN
40.0000 mg | SUBCUTANEOUS | Status: DC
  Administered 2014-06-02 – 2014-06-08 (×7): 40 mg via SUBCUTANEOUS
  Filled 2014-06-01 (×9): qty 0.4

## 2014-06-01 MED ORDER — ATROPINE SULFATE 0.1 MG/ML IJ SOLN
INTRAMUSCULAR | Status: AC
  Filled 2014-06-01: qty 10

## 2014-06-01 MED ORDER — BACITRACIN ZINC 500 UNIT/GM EX OINT
TOPICAL_OINTMENT | Freq: Two times a day (BID) | CUTANEOUS | Status: DC
  Administered 2014-06-01 – 2014-06-08 (×14): via TOPICAL
  Filled 2014-06-01 (×2): qty 28.35

## 2014-06-01 MED ORDER — ONDANSETRON HCL 4 MG PO TABS
4.0000 mg | ORAL_TABLET | Freq: Four times a day (QID) | ORAL | Status: DC | PRN
  Filled 2014-06-01: qty 1

## 2014-06-01 MED ORDER — PREDNISOLONE ACETATE 1 % OP SUSP
1.0000 [drp] | Freq: Four times a day (QID) | OPHTHALMIC | Status: DC
  Administered 2014-06-01 – 2014-06-09 (×30): 1 [drp] via OPHTHALMIC
  Filled 2014-06-01: qty 1

## 2014-06-01 MED ORDER — SORBITOL 70 % SOLN
30.0000 mL | Freq: Every day | Status: DC | PRN
  Administered 2014-06-07: 30 mL via ORAL
  Filled 2014-06-01: qty 30

## 2014-06-01 MED ORDER — BACITRACIN-POLYMYXIN B 500-10000 UNIT/GM OP OINT
TOPICAL_OINTMENT | Freq: Four times a day (QID) | OPHTHALMIC | Status: DC
  Administered 2014-06-02 – 2014-06-08 (×13): via OPHTHALMIC
  Filled 2014-06-01: qty 3.5

## 2014-06-01 MED ORDER — ENOXAPARIN SODIUM 40 MG/0.4ML ~~LOC~~ SOLN: 40.0000 mg | SUBCUTANEOUS | Status: DC

## 2014-06-01 MED ORDER — GATIFLOXACIN 0.5 % OP SOLN
1.0000 [drp] | Freq: Four times a day (QID) | OPHTHALMIC | Status: DC
  Administered 2014-06-01 – 2014-06-09 (×30): 1 [drp] via OPHTHALMIC
  Filled 2014-06-01: qty 2.5

## 2014-06-01 MED ORDER — SILVER SULFADIAZINE 1 % EX CREA
TOPICAL_CREAM | Freq: Two times a day (BID) | CUTANEOUS | Status: DC
  Administered 2014-06-01 – 2014-06-05 (×8): via TOPICAL
  Administered 2014-06-05: 1 via TOPICAL
  Administered 2014-06-06 – 2014-06-09 (×7): via TOPICAL
  Filled 2014-06-01: qty 85

## 2014-06-01 MED ORDER — METHOCARBAMOL 500 MG PO TABS: 500.0000 mg | ORAL_TABLET | Freq: Four times a day (QID) | ORAL | Status: DC | PRN

## 2014-06-01 MED ORDER — OXYCODONE HCL 5 MG PO TABS
5.0000 mg | ORAL_TABLET | ORAL | Status: DC | PRN
  Administered 2014-06-02: 10 mg via ORAL
  Administered 2014-06-03 (×2): 5 mg via ORAL
  Administered 2014-06-04: 15 mg via ORAL
  Administered 2014-06-04 (×2): 10 mg via ORAL
  Administered 2014-06-05 – 2014-06-08 (×5): 15 mg via ORAL
  Filled 2014-06-01: qty 3
  Filled 2014-06-01: qty 2
  Filled 2014-06-01: qty 1
  Filled 2014-06-01: qty 2
  Filled 2014-06-01 (×2): qty 3
  Filled 2014-06-01: qty 2
  Filled 2014-06-01 (×3): qty 3
  Filled 2014-06-01: qty 1
  Filled 2014-06-01: qty 3
  Filled 2014-06-01: qty 2

## 2014-06-01 MED ORDER — POLYETHYLENE GLYCOL 3350 17 G PO PACK
17.0000 g | PACK | Freq: Every day | ORAL | Status: DC
  Administered 2014-06-02 – 2014-06-08 (×4): 17 g via ORAL
  Filled 2014-06-01 (×9): qty 1

## 2014-06-01 MED ORDER — METOPROLOL TARTRATE 25 MG PO TABS
25.0000 mg | ORAL_TABLET | Freq: Two times a day (BID) | ORAL | Status: DC
  Administered 2014-06-01 – 2014-06-09 (×16): 25 mg via ORAL
  Filled 2014-06-01 (×18): qty 1
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
## 2014-06-01 NOTE — Discharge Summary (Signed)
Patient appropriate for rehab  This patient has been seen and I agree with the findings and treatment plan.  Marta LamasJames O. Gae BonWyatt, III, MD, FACS [phone] (pager) [phone] (direct pager) Trauma Surgeon
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
## 2014-06-01 NOTE — Clinical Social Work Note (Signed)
Clinical Social Worker continuing to follow patient and family for support and discharge planning needs.  Per inpatient rehab note, "I met with the patient, his sister Earnest Bailey, and his aunt Horris Latino at the bedside to discuss pt's post acute rehab plans. I phoned Sharyn Lull, pt's girlfriend as well. Pt. and his girlfriend are requesting admission to IP rehab as they feel this is in his best interest."  Patient and family agreeable with plan to discharge to inpatient rehab today.    Clinical Social Worker will sign off for now as social work intervention is no longer needed. Please consult [place] again if new need arises.  Barbette Or, [place]
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
## 2014-06-01 NOTE — Progress Notes (Signed)
Patient has given permission to discuss his condition, medication and treatment plan with his significant other Marcelino Duster(Michelle Catling),
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
## 2014-06-01 NOTE — Progress Notes (Signed)
Inpatient Rehabilitation  I met with the patient, his sister Earnest Bailey, and his aunt Horris Latino at the bedside to discuss pt's post acute rehab plans.  I phoned Sharyn Lull, pt's girlfriend  as well.  Pt. and his girlfriend are requesting admission to IP rehab as they feel this is in his best interest.  I reiterated to Sharyn Lull that we anticipate pt. Will need 24 hour assist at a supervision to min assist level.  I discussed pt's readiness with Silvestre Gunner, trauma PA.  He is agreeable that pt. Is ready foro rehab today.   Will make arrangements for IP rehab admission .  Have notified pt's RN Davina Poke as well as SW/CM.  Please call if questions.  [place] Admissions Coordinator Cell [phone] Office [phone]

## 2014-06-01 NOTE — Progress Notes (Signed)
Okay for Rehab.  This patient has been seen and I agree with the findings and treatment plan.  James O. Wyatt, III, MD, FACS [phone] (pager) [phone] (direct pager) Trauma Surgeon  

## 2014-06-02 ENCOUNTER — Inpatient Hospital Stay (HOSPITAL_COMMUNITY): Payer: No Typology Code available for payment source | Admitting: Speech Pathology

## 2014-06-02 ENCOUNTER — Inpatient Hospital Stay (HOSPITAL_COMMUNITY): Payer: Self-pay

## 2014-06-02 ENCOUNTER — Encounter (HOSPITAL_COMMUNITY): Payer: Self-pay | Admitting: *Deleted

## 2014-06-02 DIAGNOSIS — M7989 Other specified soft tissue disorders: Secondary | ICD-10-CM

## 2014-06-02 DIAGNOSIS — S069XAA Unspecified intracranial injury with loss of consciousness status unknown, initial encounter: Secondary | ICD-10-CM

## 2014-06-02 DIAGNOSIS — S42009A Fracture of unspecified part of unspecified clavicle, initial encounter for closed fracture: Secondary | ICD-10-CM

## 2014-06-02 DIAGNOSIS — S069X9A Unspecified intracranial injury with loss of consciousness of unspecified duration, initial encounter: Secondary | ICD-10-CM

## 2014-06-02 DIAGNOSIS — M79609 Pain in unspecified limb: Secondary | ICD-10-CM

## 2014-06-02 DIAGNOSIS — S32409A Unspecified fracture of unspecified acetabulum, initial encounter for closed fracture: Secondary | ICD-10-CM

## 2014-06-02 LAB — CBC WITH DIFFERENTIAL/PLATELET
BASOS PCT: 0 % (ref 0–1)
Basophils Absolute: 0 10*3/uL (ref 0.0–0.1)
EOS PCT: 6 % — AB (ref 0–5)
Eosinophils Absolute: 0.5 10*3/uL (ref 0.0–0.7)
HCT: 28.1 % — ABNORMAL LOW (ref 39.0–52.0)
HEMOGLOBIN: 9.4 g/dL — AB (ref 13.0–17.0)
LYMPHS ABS: 1.6 10*3/uL (ref 0.7–4.0)
Lymphocytes Relative: 18 % (ref 12–46)
MCH: 29.9 pg (ref 26.0–34.0)
MCHC: 33.5 g/dL (ref 30.0–36.0)
MCV: 89.5 fL (ref 78.0–100.0)
MONOS PCT: 10 % (ref 3–12)
Monocytes Absolute: 0.8 10*3/uL (ref 0.1–1.0)
NEUTROS PCT: 66 % (ref 43–77)
Neutro Abs: 5.6 10*3/uL (ref 1.7–7.7)
Platelets: 388 10*3/uL (ref 150–400)
RBC: 3.14 MIL/uL — AB (ref 4.22–5.81)
RDW: 13 % (ref 11.5–15.5)
WBC: 8.6 10*3/uL (ref 4.0–10.5)

## 2014-06-02 LAB — COMPREHENSIVE METABOLIC PANEL
ALBUMIN: 2.6 g/dL — AB (ref 3.5–5.2)
ALT: 33 U/L (ref 0–53)
AST: 22 U/L (ref 0–37)
Alkaline Phosphatase: 55 U/L (ref 39–117)
BUN: 12 mg/dL (ref 6–23)
CALCIUM: 8.9 mg/dL (ref 8.4–10.5)
CO2: 28 mEq/L (ref 19–32)
CREATININE: 0.75 mg/dL (ref 0.50–1.35)
Chloride: 98 mEq/L (ref 96–112)
GFR calc non Af Amer: >90 mL/min (ref >90)
GLUCOSE: 105 mg/dL — AB (ref 70–99)
Potassium: 4.3 mEq/L (ref 3.7–5.3)
Sodium: 138 mEq/L (ref 137–147)
TOTAL PROTEIN: 6.8 g/dL (ref 6.0–8.3)
Total Bilirubin: 0.3 mg/dL (ref 0.3–1.2)

## 2014-06-02 LAB — VITAMIN D 1,25 DIHYDROXY
Vitamin D 1, 25 (OH)2 Total: 95 pg/mL — ABNORMAL HIGH (ref 18–72)
Vitamin D3 1, 25 (OH)2: 95 pg/mL

## 2014-06-02 NOTE — Progress Notes (Signed)
Occupational Therapy Note  Patient Details  Name: Sherald BargeBrian D Parisi MRN: [id number] Date of Birth: 1981-06-08 Today's Date: 06/02/2014  Time: 1400-1430 Pt c/o increased pain in left hip with transitional movements (unrated); RN aware and repositioned Individual Therapy  Pt sitting in w/c, QRB in place, and finacee present.  Focus on safety awareness, transfers, and education on OT role and LTGs. Pt able to verbalize LLE WB precautions but required tot A to maintain during stand pivot transfer to bed from w/c.  Pt recalled 3/3 hip precautions.  Introduced AE to be used during BADLs in the morning.  Pt stated he had difficulty with LB dressing.   Lavone NeriLanier, [facility] 06/02/2014, 2:36 PM
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
## 2014-06-02 NOTE — Progress Notes (Signed)
VASCULAR LAB PRELIMINARY  PRELIMINARY  PRELIMINARY  PRELIMINARY  Bilateral lower extremity venous duplex completed.    Preliminary report:  Bilateral:  No evidence of DVT, superficial thrombosis, or Baker's Cyst.   SLAUGHTER, [place]IR[place], RVS 06/02/2014, 4:53 PM
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
## 2014-06-02 NOTE — Evaluation (Signed)
Evaluation reviewed and this therapist agrees with the information provided.  
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
## 2014-06-02 NOTE — Progress Notes (Signed)
Physical Therapy Session Note  Patient Details  Name: Sherald BargeBrian D Donahoo MRN: [id number] Date of Birth: 1981/10/02  Today's Date: 06/02/2014 Time: 1300-1400 Time Calculation (min): 60 min  Short Term Goals: Week 1:  PT Short Term Goal 1 (Week 1): Pt to perform bed mobility w/ supervision, 50% of time and min cueing for safety PT Short Term Goal 2 (Week 1): Pt to independently verbalize all mobility precautions PT Short Term Goal 3 (Week 1): Pt to t/f bed<>w/c w/ supervision, 50% of time and min cueing for safety PT Short Term Goal 4 (Week 1): Pt to ambulate 25' w/ use of hemi-walker and min guard A, min cueing for safety PT Short Term Goal 5 (Week 1): Pt to negotiate up/down 2 steps w/ B rail and min A, min cueing for safety  Skilled Therapeutic Interventions/Progress Updates:  1:1. Pt received sitting in w/c washing hands at sink with nurse tech in room, PT taking over. Focus this session on general education and safety, gait training, w/c management/propulsion and B LE therex. Reviewed hip precautions (handout provided), L TDWB status and overall safety regarding choice of ADs and transfer techniques at this time. Pt verbalized understanding. Pt amb 25'x2 w/ use of hemi-walker and min guard A, consistent cues for safety and L TTWB status. Pt appearing to put more that TDWB despite cues. Attempted to try amb w/ NWB L LE, but too fatigued. Pt able to safely complete multiple t/f sit<>stand w/ L UE/R LE support only and min guard A. Pt req mod cueing for management of w/c parts, but supervision for w/c propulsion room<>therapy gym. Min A for squat pivot transfer bed<>w/c x2 w/ mod cues for seq. Pt completed 2x10 reps of B hip flexion and LAQ for quad strengthening. Pt's girlfriend present at end of session and educated on general safety plan and verbalized understanding that only medical staff allowed to assist pt with mobility in room at this time. Pt reports that girlfriend assisted him with  ambulation to bathroom 1x on acute care. Pt sitting in w/c at end of session w/ all needs in reach, quick release belt in place and girlfriend in room.   Therapy Documentation Precautions:  Precautions Precautions: Fall;Shoulder;Posterior Hip;Other (comment) Type of Shoulder Precautions: R Clavicle fx- weight bearing as tolerated; L LE TDWB Shoulder Interventions: For comfort;Shoulder sling/immobilizer Precaution Booklet Issued: No Precaution Comments: Posterior Hip Prec Handout provided Restrictions Weight Bearing Restrictions: Yes RUE Weight Bearing: Weight bearing as tolerated LLE Weight Bearing: Touchdown weight bearing  See FIM for current functional status  Therapy/Group: Individual Therapy  Denzil HughesKing, Caroline S 06/02/2014, 2:58 PM

## 2014-06-03 ENCOUNTER — Inpatient Hospital Stay (HOSPITAL_COMMUNITY): Payer: No Typology Code available for payment source | Admitting: Speech Pathology

## 2014-06-03 ENCOUNTER — Encounter (HOSPITAL_COMMUNITY): Payer: Self-pay

## 2014-06-03 ENCOUNTER — Inpatient Hospital Stay (HOSPITAL_COMMUNITY): Payer: Self-pay

## 2014-06-03 LAB — T3, FREE: T3, Free: 3.2 pg/mL (ref 2.3–4.2)

## 2014-06-03 LAB — TESTOSTERONE: Testosterone: 100 ng/dL — ABNORMAL LOW (ref 300–890)

## 2014-06-03 LAB — TSH: TSH: 4.11 u[IU]/mL (ref 0.350–4.500)

## 2014-06-03 LAB — LUTEINIZING HORMONE: LH: 2 m[IU]/mL (ref 1.5–9.3)

## 2014-06-03 LAB — FOLLICLE STIMULATING HORMONE: FSH: 1.5 m[IU]/mL (ref 1.4–18.1)

## 2014-06-03 LAB — T4, FREE: FREE T4: 1.2 ng/dL (ref 0.80–1.80)

## 2014-06-03 NOTE — Progress Notes (Signed)
Social Work Patient ID: Phillip [name], male   DOB: 04/07/1981, 33 y.o.   MRN: [id number]  CSW met with pt and spoke with the girlfriend over the phone to update them on team conference.  Team set a d/c date for 06-13-14 and pt was pleased with this date.  When CSW talked with girlfriend, she expressed concern over the date being set for sooner than anticipated when pt was evaluated by MD.  CSW explained that pt has made progress already and that because of his improvement since last week when evaluated, the MD and therapists did not feel he needed 18-24 days as first expected.  She expressed understanding and feels pt will do well at home due to being young and otherwise healthy.  She did express a concern she noticed last night while visiting.  Pt complains of a "rushing" feeling in the back of his head on the left side when he lays down.  CSW told her that CSW will inform the PA of this concern.  CSW spoke with Linna Hoff Angiulli to pass on the concern.  CSW will continue to follow pt and offer support as needed.

## 2014-06-03 NOTE — Progress Notes (Signed)
Occupational Therapy Session Note  Patient Details  Name: Phillip [name] MRN: [id number] Date of Birth: 12/06/1981  Today's Date: 06/03/2014 Time: 0800-0900 Time Calculation: 60 min  Short Term Goals: Week 1:  OT Short Term Goal 1 (Week 1): Pt will sustain attention to self-care task ~15 minutes with min cues  OT Short Term Goal 2 (Week 1): Pt will identify 2 current deficits with min cues OT Short Term Goal 3 (Week 1): Pt will demonstrate recall of 2 events from previous therapy  OT Short Term Goal 4 (Week 1): Pt will complete toilet transfer at supervision level   Skilled Therapeutic Interventions/Progress Updates:   Pt seen for ADL retraining with focus on functional transfers, safety awareness, activity tolerance, sustained attention and use of AE. Pt oriented x4. Pt agreeable to shower today requiring min assist transfer w/c>shower chair and mod cues for safety/adherance to precautions. Educated on use of long handled sponge for bathing with pt demonstrating appropriate use during shower to reach LE. Completed shower with min cues for sustained attention to task and mod cues for safety awareness as pt impulsively standing throughout shower requiring steadying assist and use of grab bar for sit<>stand. Completed UB dressing without cues and transferred to w/c with min assist/cues. Educated on use of AE (reacher and sock aid) with pt able to demonstrate appropriate use with mod cues and extra time for LB dressing requiring min assist sit<>stand to manipulate clothing over hips and min assist for donning L sock and shoe secondary to pain. Pt requesting to shave and completed in w/c at sink with mod cues for safety awareness. Pt left sitting in w/c with PT present.     Therapy Documentation Precautions:  Precautions Precautions: Fall;Shoulder;Posterior Hip;Other (comment) Type of Shoulder Precautions: R Clavicle fx- weight bearing as tolerated; L LE TDWB Shoulder Interventions: For  comfort;Shoulder sling/immobilizer Precaution Booklet Issued: No Precaution Comments: Posterior Hip Prec Handout provided Restrictions Weight Bearing Restrictions: Yes RUE Weight Bearing: Weight bearing as tolerated LLE Weight Bearing: Touchdown weight bearing (With walker)  Pain: Pt reporting LLE pain at this time.   ADL:   See FIM for current functional status  Therapy/Group: Individual Therapy  Spake,Lauren Brooke 06/03/2014, 4:07 PM

## 2014-06-03 NOTE — Plan of Care (Signed)
Problem: RH Ambulation Goal: LTG Patient will ambulate in controlled environment (PT) LTG: Patient will ambulate in a controlled environment, # of feet with assistance (PT).  Upgraded due to improved safety and functional endurance with use of rolling walker.  Goal: LTG Patient will ambulate in home environment (PT) LTG: Patient will ambulate in home environment, # of feet with assistance (PT).  Upgraded due to improved safety and functional endurance with use of rolling walker.   Problem: RH Wheelchair Mobility Goal: LTG Patient will propel w/c in home environment (PT) LTG: Patient will propel wheelchair in home environment, # of feet with assistance (PT).  Goal d/c due to improved ambulatory safety, quality and endurance.

## 2014-06-03 NOTE — Progress Notes (Signed)
[facility]     PROGRESS NOTE    Subjective/Complaints: Did well with therapies yesterday. Only really using tylenol for pain A  review of systems has been performed and if not noted above is otherwise negative.   Objective: Vital Signs: Blood pressure 138/80, pulse 98, temperature 98.5 F (36.9 C), temperature source Oral, resp. rate 20, height 5\' 9"  (1.753 m), weight 83.598 kg (184 lb 4.8 oz), SpO2 95.00%. No results found.  Recent Labs  06/01/14 1943 06/02/14 0445  WBC 8.3 8.6  HGB 9.1* 9.4*  HCT 27.6* 28.1*  PLT 404* 388    Recent Labs  06/01/14 0324 06/02/14 0445  NA  --  138  K  --  4.3  CL  --  98  GLUCOSE  --  105*  BUN  --  12  CREATININE 0.72 0.75  CALCIUM  --  8.9   CBG (last 3)  No results found for this basename: GLUCAP,  in the last 72 hours  Wt Readings from Last 3 Encounters:  06/01/14 83.598 kg (184 lb 4.8 oz)  05/25/14 81.647 kg (180 lb)  05/25/14 81.647 kg (180 lb)    Physical Exam:  General Comments: Pt able to recall precautions this session however continues to to need constant reminders. Pt continues to attempt to use right UE with all mobilty after constant cues of nonweightbearing  Physical Exam:  Blood pressure 133/83, pulse 99, temperature 98.6 F (37 C), temperature source Oral, resp. rate 18, height 5\' 9"  (1.753 m), weight 81.647 kg (180 lb), SpO2 100.00%.  Constitutional: He appears well-developed and well-nourished. He appears alert  Eyes: Pupils reactive to light  Right lid swelling. Vision obscured through the right eye but able to ID objects in all fields Cardiovascular: Regular rhythm. Tachycardia present.  Lungs /pulmonary .clear to auscultation  Abdomen .soft positive bowel sounds nontender  Neurological: alert. Moves all 4's but right shoulder , legs remained limited proximally due to pain. No gross sensory loss. CN notable for altered vision OD. Fair insight and awareness.  Skin:   Multiple facial abrasions and limb abrasions/areas of eschar    Assessment/Plan: 1. 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 Physiatrist is providing close team supervision and 24 hour management of active medical problems listed below. Physiatrist and rehab team continue to assess barriers to discharge/monitor patient progress toward functional and medical goals. FIM: FIM - Bathing Bathing Steps Patient Completed: Chest;Right Arm;Left Arm;Abdomen;Front perineal area;Buttocks;Right upper leg;Left upper leg Bathing: 4: Min-Patient completes 8-9 1988f 10 parts or 75+ percent  FIM - Upper Body Dressing/Undressing Upper body dressing/undressing steps patient completed: Thread/unthread right sleeve of pullover shirt/dresss;Thread/unthread left sleeve of pullover shirt/dress;Put head through opening of pull over shirt/dress;Pull shirt over trunk Upper body dressing/undressing: 5: Supervision: Safety issues/verbal cues FIM - Lower Body Dressing/Undressing Lower body dressing/undressing steps patient completed: Pull underwear up/down;Pull pants up/down;Fasten/unfasten pants Lower body dressing/undressing: 2: Max-Patient completed 25-49% of tasks  FIM - Toileting Toileting steps completed by patient: Performs perineal hygiene (pt not wearing clothing) Toileting Assistive Devices: Grab bar or rail for support Toileting: 5: Supervision: Safety issues/verbal cues  FIM - Diplomatic Services operational officerToilet Transfers Toilet Transfers Assistive Devices: Grab bars Toilet Transfers: 4-To toilet/BSC: Min A (steadying Pt. > 75%);4-From toilet/BSC: Min A (steadying Pt. > 75%)  FIM - Bed/Chair Transfer Bed/Chair Transfer Assistive Devices: Bed rails;Arm rests;HOB elevated Bed/Chair Transfer: 4: Supine > Sit: Min A (  steadying Pt. > 75%/lift 1 leg);4: Sit > Supine: Min A (steadying pt. > 75%/lift 1 leg);4: Bed > Chair or W/C:  Min A (steadying Pt. > 75%)  FIM - Locomotion: Wheelchair Distance: 150'x2 Locomotion: Wheelchair: 5: Travels 150 ft or more: maneuvers on rugs and over door sills with supervision, cueing or coaxing FIM - Locomotion: Ambulation Locomotion: Ambulation Assistive Devices: Chief Operating OfficerWalker - Hemi Ambulation/Gait Assistance: 4: Min assist;4: Min guard Locomotion: Ambulation: 1: Travels less than 50 ft with minimal assistance (Pt.>75%)  Comprehension Comprehension Mode: Auditory Comprehension: 5-Understands complex 90% of the time/Cues < 10% of the time  Expression Expression Mode: Verbal Expression: 6-Expresses complex ideas: With extra time/assistive device  Social Interaction Social Interaction: 5-Interacts appropriately 90% of the time - Needs monitoring or encouragement for participation or interaction.  Problem Solving Problem Solving: 5-Solves complex 90% of the time/cues < 10% of the time  Memory Memory: 5-Recognizes or recalls 90% of the time/requires cueing < 10% of the time Medical Problem List and Plan:  1. 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 DVT Prophylaxis/Anticoagulation: Subcutaneous Lovenox.  vascular studies pending 3. Pain Management: Oxycodone and Robaxin as needed. Monitor with increased mobility   -?left supra-patellar pain?---observe for now. Consider xr 4. Mood/agitation. Improved. Bed alarm--observe  5. Neuropsych: This patient is capable of making decisions on his own behalf.  6. Acute blood loss anemia. Followup CBC  7. Left acetabular fracture. Status post ORIF. Touchdown weightbearing/posterior hip precautions  8. Right clavicle fracture.  Weightbearing as tolerated range of motion as tolerated with sling for comfort  9. I&D of right calf wound and I&D with closure left arm wound 05/26/2014. Continue current care  10. Open globe injury right eye. Repair of eye injuries by Dr. Clarisa KindredGeiger 05/25/2014    LOS (Days) 2 A FACE TO FACE EVALUATION  WAS PERFORMED  SWARTZ,ZACHARY T 06/03/2014 8:31 AM

## 2014-06-03 NOTE — Progress Notes (Signed)
[facility] Individual Statement of Services  Patient Name:  Phillip BargeBrian D Weinhold  Date:  06/03/2014  Welcome to the [facility].  Our goal is to provide you with an individualized program based on your diagnosis and situation, designed to meet your specific needs.  With this comprehensive rehabilitation program, you will be expected to participate in at least 3 hours of rehabilitation therapies Monday-Friday, with modified therapy programming on the weekends.  Your rehabilitation program will include the following services:  Physical Therapy (PT), Occupational Therapy (OT), 24 hour per day rehabilitation nursing, Therapeutic Recreation (TR), Case Management (Social Worker), Rehabilitation Medicine, Nutrition Services and Pharmacy Services  Weekly team conferences will be held on Tuesdays to discuss your progress.  Your Social Worker will talk with you frequently to get your input and to update you on team discussions.  Team conferences with you and your family in attendance may also be held.  Expected length of stay:  12-14 days  Overall anticipated outcome:  Supervision  Depending on your progress and recovery, your program may change. Your Social Worker will coordinate services and will keep you informed of any changes. Your Social Worker's name and contact numbers are listed  below.  The following services may also be recommended but are not provided by the [facility]patient [facility]:   Driving Evaluations  Home Health Rehabiltiation Services  Outpatient Rehabilitation Services  Vocational Rehabilitation   Arrangements will be made to provide these services after discharge if needed.  Arrangements include referral to agencies that provide these services.  Your insurance has been verified to be:  Med Pay Assurance Your primary doctor is:  Does not have one  Pertinent information will be shared with your doctor and your insurance  company.  Social Worker:  Staci AcostaJenny Prevatt, LCSW  [phone] or ([phone]) [phone]  Information discussed with and copy given to patient by: Elvera LennoxPrevatt, Jennifer Capps, 06/03/2014, 11:16 AM

## 2014-06-03 NOTE — Progress Notes (Signed)
Speech Language Pathology Daily Session Note  Patient Details  Name: Phillip [name] MRN: [id number] Date of Birth: 08/27/1981  Today's Date: 06/03/2014 Time: 1335-1430 Time Calculation (min): 55 min  Short Term Goals: Week 1: SLP Short Term Goal 1 (Week 1): Patient will recall and utilize weight bearing restrictions with Supervision cues SLP Short Term Goal 2 (Week 1): Patient will solve complex problems realted to home management with Supervision cues SLP Short Term Goal 3 (Week 1): Patient will recall new information with Supervision cues SLP Short Term Goal 4 (Week 1): Patient will alternate attention for 10 minutes with Supervision cues for redirection  Skilled Therapeutic Interventions: Skilled treatment session focused on addressing cognition goals.  Patient selected attention in a very noisy environment for ~30 minutes without need for redirection while solving moderately complex problems with Supervision level question cues to utilize working memory strategies as well as to self-monitor and correct errors.  During transfers patient required Supervision question cues to recall and utilize weightbearing precaution in the moment.   FIM:  Comprehension Comprehension Mode: Auditory Comprehension: 5-Follows basic conversation/direction: With no assist Expression Expression Mode: Verbal Expression: 5-Expresses basic needs/ideas: With no assist Social Interaction Social Interaction: 5-Interacts appropriately 90% of the time - Needs monitoring or encouragement for participation or interaction. Problem Solving Problem Solving: 5-Solves basic 90% of the time/requires cueing < 10% of the time Memory Memory: 5-Recognizes or recalls 90% of the time/requires cueing < 10% of the time  Pain Pain Assessment Pain Assessment: 0-10 Pain Score: 2   Therapy/Group: Individual Therapy  Phillip FerrettiMelissa [name], M.A., CCC-SLP [phone]  [name],Phillip 06/03/2014, 4:07 PM

## 2014-06-03 NOTE — Progress Notes (Signed)
Physical Therapy Session Note  Patient Details  Name: Phillip [name] MRN: [id number] Date of Birth: 1981-05-03  Today's Date: 06/03/2014 Time: Treatment Session 1: 0900-1015; Treatment Session 2: 1445-1530 Time Calculation (min): Treatment Session 1: 75 min; Treatment Session 2: 45min  Short Term Goals: Week 1:  PT Short Term Goal 1 (Week 1): Pt to perform bed mobility w/ supervision, 50% of time and min cueing for safety PT Short Term Goal 2 (Week 1): Pt to independently verbalize all mobility precautions PT Short Term Goal 3 (Week 1): Pt to t/f bed<>w/c w/ supervision, 50% of time and min cueing for safety PT Short Term Goal 4 (Week 1): Pt to ambulate 25' w/ use of hemi-walker and min guard A, min cueing for safety PT Short Term Goal 5 (Week 1): Pt to negotiate up/down 2 steps w/ B rail and min A, min cueing for safety  Skilled Therapeutic Interventions/Progress Updates:  Treatment Session 1:  1:1. Pt received sitting in w/c with OT in room, PT taking over. Focus this session on gait training, w/c management/propulsion, functional transfers and therex. Pt req min cueing for management of w/c parts throughout session and (S) for w/c propulsion 200'x2 w/ B UE and good pace. Pt demonstrated good tolerance to use of RW during amb 65'x1 and 30'x1 on hard level and carpeted surfaces w/ close(S)-min guard A. Pt encouraged to hop on R LE and maintain NWB L LE due to difficulty consistently maintaining TDWB. Pt practiced multiple SPT w/ RW, req close(S) and min cues for safety as well as multiple t/f sup<>sit in standard bed w/ mod cueing for problem solving and tactile cues for quad contraction for improved L LE clearance. Pt able to safely negotiate up/down 5 steps w/ use of B rail, hopping on R LE only w/ min guard A. B LE therex performed for strength/endurance, exercises included 2x10 reps of: alternated marching, alternated LAQ. Pt w/ reports of pain throughout session during mobility, RN aware. Pt  supine in bed at end of session w/ all needs in reach, bed alarm on.    Treatment Session 2: 1:1. Pt received semi-reclined in bed, ready for therapy. Focus this session on functional transfers, w/c propulsion/management and gait training. Pt able to perform t/f sup<>sit EOB w/ overall supervision using bed rails. Pt demonstrating good problem solving completion of car transfer while maintaining posterior hip precautions w/ min cueing for various techniques. Pt with overall good tolerance to ambulation 200'x1 w/ RW, hopping on R LE and maintaining NWB L LE. Ambulatory endurance limited by discomfort due to R clavical fx, min cues to sit and utilize w/c instead. Pt able to safely negotiate various obstacles on floor as well as curb step x2 w/ use of RW and close(S)-min guard A. Pt able to manage parts of w/c w/ min cueing and req distant supervision for w/c propulsion 150'x1 and 250'x1 w/ use of B UE and use of R LE. Pt semi-reclined in bed at end of session w/ all needs in reach, bed alarm on.   Therapy Documentation Precautions:  Precautions Precautions: Fall;Shoulder;Posterior Hip;Other (comment) Type of Shoulder Precautions: R Clavicle fx- weight bearing as tolerated; L LE TDWB Shoulder Interventions: For comfort;Shoulder sling/immobilizer Precaution Booklet Issued: No Precaution Comments: Posterior Hip Prec Handout provided Restrictions Weight Bearing Restrictions: Yes RUE Weight Bearing: Weight bearing as tolerated LLE Weight Bearing: Touchdown weight bearing General:   See FIM for current functional status  Therapy/Group: Individual Therapy  Denzil HughesKing, Caroline S 06/03/2014, 10:23 AM
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
## 2014-06-04 ENCOUNTER — Inpatient Hospital Stay (HOSPITAL_COMMUNITY): Payer: No Typology Code available for payment source | Admitting: Physical Therapy

## 2014-06-04 ENCOUNTER — Inpatient Hospital Stay (HOSPITAL_COMMUNITY): Payer: Self-pay | Admitting: Occupational Therapy

## 2014-06-04 ENCOUNTER — Inpatient Hospital Stay (HOSPITAL_COMMUNITY): Payer: Self-pay | Admitting: Speech Pathology

## 2014-06-04 ENCOUNTER — Encounter (HOSPITAL_COMMUNITY): Payer: Self-pay

## 2014-06-04 ENCOUNTER — Inpatient Hospital Stay (HOSPITAL_COMMUNITY): Payer: No Typology Code available for payment source | Admitting: *Deleted

## 2014-06-04 NOTE — Progress Notes (Signed)
[facility] PHYSICAL MEDICINE & REHABILITATION     PROGRESS NOTE    Subjective/Complaints: Participating in therapies. Pain controlled A  review of systems has been performed and if not noted above is otherwise negative.   Objective: Vital Signs: Blood pressure 122/73, pulse 87, temperature 98 F (36.7 C), temperature source Oral, resp. rate 18, height 5\' 9"  (1.753 m), weight 83.598 kg (184 lb 4.8 oz), SpO2 97.00%. No results found.  Recent Labs  06/01/14 1943 06/02/14 0445  WBC 8.3 8.6  HGB 9.1* 9.4*  HCT 27.6* 28.1*  PLT 404* 388    Recent Labs  06/02/14 0445  NA 138  K 4.3  CL 98  GLUCOSE 105*  BUN 12  CREATININE 0.75  CALCIUM 8.9   CBG (last 3)  No results found for this basename: GLUCAP,  in the last 72 hours  Wt Readings from Last 3 Encounters:  06/01/14 83.598 kg (184 lb 4.8 oz)  05/25/14 81.647 kg (180 lb)  05/25/14 81.647 kg (180 lb)    Physical Exam:  General Comments: Pt able to recall precautions this session however continues to to need constant reminders. Pt continues to attempt to use right UE with all mobilty after constant cues of nonweightbearing  Physical Exam:  Blood pressure 133/83, pulse 99, temperature 98.6 F (37 C), temperature source Oral, resp. rate 18, height 5\' 9"  (1.753 m), weight 81.647 kg (180 lb), SpO2 100.00%.  Constitutional: He appears well-developed and well-nourished. He appears alert  Eyes: Pupils reactive to light  Right lid swelling. Vision obscured through the right eye but able to ID objects in all fields Cardiovascular: Regular rhythm. Tachycardia present.  Lungs /pulmonary .clear to auscultation  Abdomen .soft positive bowel sounds nontender  Neurological: alert. Moves all 4's but right shoulder , legs remained limited proximally due to pain. No gross sensory loss. CN notable for altered vision OD. Fair insight and awareness.  Skin:  Multiple facial abrasions and limb abrasions/areas of eschar     Assessment/Plan: 1. 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 Physiatrist is providing close team supervision and 24 hour management of active medical problems listed below. Physiatrist and rehab team continue to assess barriers to discharge/monitor patient progress toward functional and medical goals.  Answered questions about work, recovery today.   Pt doing well with weight bearing precautions   FIM: FIM - Bathing Bathing Steps Patient Completed: Chest;Right Arm;Left Arm;Abdomen;Front perineal area;Buttocks;Right upper leg;Left upper leg;Right lower leg (including foot);Left lower leg (including foot) Bathing: 4: Steadying assist  FIM - Upper Body Dressing/Undressing Upper body dressing/undressing steps patient completed: Thread/unthread right sleeve of pullover shirt/dresss;Thread/unthread left sleeve of pullover shirt/dress;Put head through opening of pull over shirt/dress;Pull shirt over trunk Upper body dressing/undressing: 5: Supervision: Safety issues/verbal cues FIM - Lower Body Dressing/Undressing Lower body dressing/undressing steps patient completed: Pull underwear up/down;Pull pants up/down;Fasten/unfasten pants;Thread/unthread right underwear leg;Thread/unthread left underwear leg;Thread/unthread right pants leg;Thread/unthread left pants leg;Don/Doff right sock;Don/Doff right shoe Lower body dressing/undressing: 4: Min-Patient completed 75 plus % of tasks  FIM - Toileting Toileting steps completed by patient: Performs perineal hygiene (pt not wearing clothing) Toileting Assistive Devices: Grab bar or rail for support Toileting: 5: Supervision: Safety issues/verbal cues  FIM - Diplomatic Services operational officerToilet Transfers Toilet Transfers Assistive Devices: Grab bars Toilet Transfers: 4-To toilet/BSC: Min A (steadying Pt. > 75%);4-From toilet/BSC: Min A (steadying Pt. > 75%)  FIM -  Bed/Chair Transfer Bed/Chair Transfer Assistive Devices: Bed  rails;Arm rests Bed/Chair Transfer: 4: Bed > Chair or W/C: Min A (steadying Pt. > 75%)  FIM - Locomotion: Wheelchair Distance: 150'x2 Locomotion: Wheelchair: 5: Travels 150 ft or more: maneuvers on rugs and over door sills with supervision, cueing or coaxing FIM - Locomotion: Ambulation Locomotion: Ambulation Assistive Devices: Designer, industrial/productWalker - Rolling Ambulation/Gait Assistance: 5: Supervision Locomotion: Ambulation: 2: Travels 50 - 149 ft with supervision/safety issues  Comprehension Comprehension Mode: Auditory Comprehension: 5-Follows basic conversation/direction: With no assist  Expression Expression Mode: Verbal Expression: 5-Expresses basic needs/ideas: With no assist  Social Interaction Social Interaction: 5-Interacts appropriately 90% of the time - Needs monitoring or encouragement for participation or interaction.  Problem Solving Problem Solving: 5-Solves basic 90% of the time/requires cueing < 10% of the time  Memory Memory: 5-Recognizes or recalls 90% of the time/requires cueing < 10% of the time Medical Problem List and Plan:  1. 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 DVT Prophylaxis/Anticoagulation: Subcutaneous Lovenox.  vascular studies pending 3. Pain Management: Oxycodone and Robaxin as needed. Monitor with increased mobility   -?left supra-patellar pain?---observe for now. Consider xr 4. Mood/agitation. Improved. Bed alarm--observe  5. Neuropsych: This patient is capable of making decisions on his own behalf.  6. Acute blood loss anemia. Followup CBC  7. Left acetabular fracture. Status post ORIF. Touchdown weightbearing/posterior hip precautions  8. Right clavicle fracture.  Weightbearing as tolerated range of motion as tolerated with sling for comfort  9. I&D of right calf wound and I&D with closure left arm wound 05/26/2014. Continue current care  10. Open globe injury right eye.  Repair of eye injuries by Dr. Clarisa KindredGeiger 05/25/2014    LOS (Days) 3 A FACE TO FACE EVALUATION WAS PERFORMED  SWARTZ,ZACHARY T 06/04/2014 8:48 AM

## 2014-06-04 NOTE — Progress Notes (Signed)
Speech Language Pathology Daily Session Note  Patient Details  Name: Phillip [name] MRN: [id number] Date of Birth: May 06, 1981  Today's Date: 06/04/2014 Time: [id number] Time Calculation (min): 25 min  Short Term Goals: Week 1: SLP Short Term Goal 1 (Week 1): Patient will recall and utilize weight bearing restrictions with Supervision cues SLP Short Term Goal 2 (Week 1): Patient will solve complex problems realted to home management with Supervision cues SLP Short Term Goal 3 (Week 1): Patient will recall new information with Supervision cues SLP Short Term Goal 4 (Week 1): Patient will alternate attention for 10 minutes with Supervision cues for redirection  Skilled Therapeutic Interventions:  Pt was seen for skilled speech therapy targeting recall of weight bearing precautions and alternating attention.  Pt was received from OT session seated at edge of bed.  Pt remained pleasantly engaged and interactive throughout session and alternated attention between therapist and use of walker to ambulate from pt room to treatment room for approximately 5-7 minutes with supervision.  Pt recalled at least 3 weight bearing precautions with intermittent question cues and utilized them during functional transfers with modified indendence.  No overt safety concerns noted on this date and pt recalled at least 2 safety precautions currently in use while in room to prevent falls.  Continue per current plan of care.   FIM:  Comprehension Comprehension Mode: Auditory Comprehension: 5-Follows basic conversation/direction: With no assist Expression Expression Mode: Verbal Expression: 5-Expresses basic 90% of the time/requires cueing < 10% of the time. Social Interaction Social Interaction: 5-Interacts appropriately 90% of the time - Needs monitoring or encouragement for participation or interaction. Problem Solving Problem Solving: 5-Solves basic 90% of the time/requires cueing < 10% of the  time Memory Memory: 4-Recognizes or recalls 75 - 89% of the time/requires cueing 10 - 24% of the time  Pain Pain Assessment Pain Assessment: No/denies pain Pain Score: 0-No pain  Therapy/Group: Individual Therapy  Jackalyn LombardNicole Page, M.A. CCC-SLP   Page, Melanee SpryNicole L 06/04/2014, 4:42 PM

## 2014-06-04 NOTE — Progress Notes (Signed)
Physical Therapy Session Note  Patient Details  Name: Phillip [name] MRN: [id number] Date of Birth: 23-Sep-1981  Today's Date: 06/04/2014 Time: 0930-1000  and 1345-1445 Time Calculation (min): 30 min and 60 min  Short Term Goals: Week 1:  PT Short Term Goal 1 (Week 1): Pt to perform bed mobility w/ supervision, 50% of time and min cueing for safety PT Short Term Goal 2 (Week 1): Pt to independently verbalize all mobility precautions PT Short Term Goal 3 (Week 1): Pt to t/f bed<>w/c w/ supervision, 50% of time and min cueing for safety PT Short Term Goal 4 (Week 1): Pt to ambulate 25' w/ use of hemi-walker and min guard A, min cueing for safety PT Short Term Goal 5 (Week 1): Pt to negotiate up/down 2 steps w/ B rail and min A, min cueing for safety  Skilled Therapeutic Interventions/Progress Updates:   AM Session: Session focused on activity tolerance, functional transfers, and LE strengthening. Pt requires supervision overall for functional transfers and gait except min A for LLE when transferring sit <> supine on mat table to R. Pt received sitting EOB and ambulated to/from bathroom with RW, using grab bar for standing balance while urinating. Pt able to recall 2/3 posterior hip precautions. Gait training 2 x 150 ft using RW, pt able to maintain TDWB precautions. For LE strengthening/ROM, pt performed seated B LAQ, standing LLE marching with RW for UE support, LLE supine heel slides with maxi slide, 2 x 12 each exercise. Pt reports he typically gets into bed on L side at home and has not attempted going to R. Practiced transferring sit <> supine on R side of mat, requiring min A for LLE and c/o L knee pain when attempting to use leg lifter, vc's for sequencing and technique. Pt returned to room and left sitting EOB with all needs within reach.  PM Session: Session focused on functional mobility, activity tolerance, and community/outdoor ambulation. [name] very supportive and present  for session. Pt recalls 3/3 posterior hip precautions. Pt electing to use w/c more this session due to increased R UE pain and report of overuse with RW. Pt requires supervision for all functional mobility. Pt performed bed mobility on regular bed in ADL apartment on L side of bed, S. Pt performed car transfer using RW with supervision, pt edu provided regarding transfer technique to avoid hip flex >90 deg and pt able to maintain all precautions. Stair negotiation up/down four 6.5 in stairs with 2 rails, supervision. Pt transported to hospital lobby in w/c. Gait training in community and outdoor environments using RW including in mildly crowded lobby, inclines/declines, as well as tile, brick, and concrete. Pt negotiated up/down 5 brick steps with 1 rail, hopping laterally, close supervision. Pt performed LLE strengthening, including LAQ 2 x 12 and attempted SLR. Pt unable to perform active assisted SLR due to pain, instructed in quad setting x 10 for improved quad activation/decreased knee extension lag. Pt returned to room and left sitting EOB with girlfriend present.   Therapy Documentation Precautions:  Precautions Precautions: Fall;Shoulder;Posterior Hip;Other (comment) Type of Shoulder Precautions: R Clavicle fx- weight bearing as tolerated; L LE TDWB Shoulder Interventions: For comfort;Shoulder sling/immobilizer Precaution Booklet Issued: No Precaution Comments: Posterior Hip Prec Handout provided Restrictions Weight Bearing Restrictions: Yes RUE Weight Bearing: Weight bearing as tolerated LLE Weight Bearing: Touchdown weight bearing (With walker)  See FIM for current functional status  Therapy/Group: Individual Therapy  Kerney ElbeVarner, Phillip [name] A 06/04/2014, 10:08 AM

## 2014-06-04 NOTE — Progress Notes (Signed)
Note reviewed and accurately reflects treatment session.   
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
## 2014-06-04 NOTE — Progress Notes (Signed)
Occupational Therapy Session Note  Patient Details  Name: Phillip [name] MRN: [id number] Date of Birth: August 12, 1981  Today's Date: 06/04/2014 Time: 1000-1100 Time Calculation: 60 min  Short Term Goals: Week 1:  OT Short Term Goal 1 (Week 1): Pt will sustain attention to self-care task ~15 minutes with min cues  OT Short Term Goal 2 (Week 1): Pt will identify 2 current deficits with min cues OT Short Term Goal 3 (Week 1): Pt will demonstrate recall of 2 events from previous therapy  OT Short Term Goal 4 (Week 1): Pt will complete toilet transfer at supervision level   Skilled Therapeutic Interventions/Progress Updates:   Pt seen for ADL retraining with focus on functional transfers, functional mobility, standing balance, intellectual awareness, use of AE, attention, safety awareness and memory recall. Pt received sitting EOB, oriented x4 and requesting to use toilet. Performed sit>stand and ambulation using RW to toilet with min assist and min cues demonstrating improved balance and postural control during functional mobility. Completed sit<>stand toilet transfer with steadying assist and min cues for precautions with pt perseverating on use of toilet paper. Transferred to shower chair to complete bathing at shower level with mod cues for sequencing and safety as pt impulsively standing throughout task requiring steadying assist for balance however initiating use of AE. Pt able to identify recall of 2 previous therapy events with mod cues. Completed LB dressing sitting EOB with pt initiating use of reacher and able to complete all steps today using AE, extra time, min cues and steadying assist sit>stand to manipulate over hips. Pt able to recall 2 current deficits with extra time, mod cues and external aid demonstrating improved intellectual awareness. Completed hand washing and oral care in standing with steadying assist and min cues demonstrating improved standing balance and adherence to  precautions. Performed sit>stand at EOB using RW at supervision level. Received phone call and pt able to sustain attention to conversation ~2 minutes and write down information simultaneously. Pt left sitting EOB with PT present.   Therapy Documentation Precautions:  Precautions Precautions: Fall;Shoulder;Posterior Hip;Other (comment) Type of Shoulder Precautions: R Clavicle fx- weight bearing as tolerated; L LE TDWB Shoulder Interventions: For comfort;Shoulder sling/immobilizer Precaution Booklet Issued: No Precaution Comments: Posterior Hip Prec Handout provided Restrictions Weight Bearing Restrictions: Yes RUE Weight Bearing: Weight bearing as tolerated LLE Weight Bearing: Touchdown weight bearing (With walker)    Pain: Pt reporting no pain at this time.   See FIM for current functional status  Therapy/Group: Individual Therapy  Spake,Lauren Brooke 06/04/2014, 1:50 PM

## 2014-06-04 NOTE — Progress Notes (Signed)
Physical Therapy Session Note  Patient Details  Name: Phillip [name] MRN: [id number] Date of Birth: [birth year]  Today's Date: 06/04/2014 Time: 13:02-14:02 (30min)  Short Term Goals: Week 1:  PT Short Term Goal 1 (Week 1): Pt to perform bed mobility w/ supervision, 50% of time and min cueing for safety PT Short Term Goal 2 (Week 1): Pt to independently verbalize all mobility precautions PT Short Term Goal 3 (Week 1): Pt to t/f bed<>w/c w/ supervision, 50% of time and min cueing for safety PT Short Term Goal 4 (Week 1): Pt to ambulate 25' w/ use of hemi-walker and min guard A, min cueing for safety PT Short Term Goal 5 (Week 1): Pt to negotiate up/down 2 steps w/ B rail and min A, min cueing for safety  Skilled Therapeutic Interventions/Progress Updates:    Tx focused on safety, functional mobility, and stairs. Pt up EOB eating. Reviewed safety and precautions. Pt able to recall all hip precautions independtly.   Performed RW navigation in room with RW and close S, including toilet transfer. Mobility perofrmed within all precaution limits.   Performed 10 stirs with 2 rails and min-guard>S assist with safe technique.   WC propulsion 2x150' with S and parts assist.   Static standing during functioal tasks with 1LE and 1UE assist.   Pt needing 1 LE lifting assist for sit>supine and was left with all needs in reach.    Therapy Documentation Precautions:  Precautions Precautions: Fall;Shoulder;Posterior Hip;Other (comment) Type of Shoulder Precautions: R Clavicle fx- weight bearing as tolerated; L LE TDWB Shoulder Interventions: For comfort;Shoulder sling/immobilizer Precaution Booklet Issued: No Precaution Comments: Posterior Hip Prec Handout provided Restrictions Weight Bearing Restrictions: Yes RUE Weight Bearing: Weight bearing as tolerated LLE Weight Bearing: Touchdown weight bearing (With walker)    Pain: Pain Assessment Pain Assessment: No/denies pain    Locomotion  : Ambulation Ambulation/Gait Assistance: 5: Supervision   See FIM for current functional status  Therapy/Group: Individual Therapy Clydene Lamingole Kampen, PT, DPT  06/04/2014, 1:11 PM

## 2014-06-04 NOTE — Progress Notes (Signed)
Occupational Therapy Session Note  Patient Details  Name: Sherald BargeBrian D Landowski MRN: [id number] Date of Birth: 10-17-1981  Today's Date: 06/04/2014 Time: [id number] Time Calculation (min): 55 min  Short Term Goals: Week 1:  OT Short Term Goal 1 (Week 1): Pt will sustain attention to self-care task ~15 minutes with min cues  OT Short Term Goal 2 (Week 1): Pt will identify 2 current deficits with min cues OT Short Term Goal 3 (Week 1): Pt will demonstrate recall of 2 events from previous therapy  OT Short Term Goal 4 (Week 1): Pt will complete toilet transfer at supervision level   Skilled Therapeutic Interventions/Progress Updates:  Patient received supine in bed with no complaints of pain. Patient engaged in bed mobility and sat EOB. Patient able to verbalize 3/3 hip precautions and weight bearing restrictions independently. Therapist handed patient his shoes and patient donned shoes using reacher secondary to hip precautions. Patient did require min verbal cues in order to adhere to hip precautions functionally. From here, patient stood with RW and ambulated > sink for grooming task of brushing teeth in standing position. Patient then transferred > w/c and propelled self from room > therapy gym. Patient transferred onto therapy mat with supervision and min verbal cues for safety with w/c set-up and transfer (squat pivot technique). Therapist administered elastic shoe strings and patient worked on taking old shoe strings out and putting new elastic ones on. Therapist then educated patient on RUE strengthening AROM exercises. Patient ambulated back to room using RW, adhering to TDWB restrictions > LLE independently. Patient transferred > EOB and therapist left patient with all needs within reach and bed alarm set. During session focused skilled intervention on attention, problem solving, adhering to precautions & weight bearing restrictions, sit<>stands, functional transfers, functional ambulation with RW,  RUE strengthening AROM exercises, and overall activity tolerance/endurance. Patient with no complaints of pain throughout session.   Precautions:  Precautions Precautions: Fall;Shoulder;Posterior Hip;Other (comment) Type of Shoulder Precautions: R Clavicle fx- weight bearing as tolerated; L LE TDWB Shoulder Interventions: For comfort;Shoulder sling/immobilizer Precaution Booklet Issued: No Precaution Comments: Posterior Hip Prec Handout provided Restrictions Weight Bearing Restrictions: Yes RUE Weight Bearing: Weight bearing as tolerated LLE Weight Bearing: Touchdown weight bearing (With walker)  See FIM for current functional status  Therapy/Group: Individual Therapy  CLAY,PATRICIA 06/04/2014, 8:40 AM

## 2014-06-05 ENCOUNTER — Inpatient Hospital Stay (HOSPITAL_COMMUNITY): Payer: Self-pay

## 2014-06-05 ENCOUNTER — Inpatient Hospital Stay (HOSPITAL_COMMUNITY): Payer: No Typology Code available for payment source

## 2014-06-05 ENCOUNTER — Encounter (HOSPITAL_COMMUNITY): Payer: Self-pay

## 2014-06-05 ENCOUNTER — Inpatient Hospital Stay (HOSPITAL_COMMUNITY): Payer: Self-pay | Admitting: Physical Therapy

## 2014-06-05 DIAGNOSIS — S069X9A Unspecified intracranial injury with loss of consciousness of unspecified duration, initial encounter: Secondary | ICD-10-CM

## 2014-06-05 DIAGNOSIS — S32409A Unspecified fracture of unspecified acetabulum, initial encounter for closed fracture: Secondary | ICD-10-CM

## 2014-06-05 DIAGNOSIS — S42009A Fracture of unspecified part of unspecified clavicle, initial encounter for closed fracture: Secondary | ICD-10-CM

## 2014-06-05 DIAGNOSIS — S069XAA Unspecified intracranial injury with loss of consciousness status unknown, initial encounter: Secondary | ICD-10-CM

## 2014-06-05 NOTE — Progress Notes (Signed)
[place] [facility]IC[facility] RE[facility]ILITATION     PROGRESS NOTE    Subjective/Complaints: No issues. Continues to progress A  review of systems has been performed and if not noted above is otherwise negative.   Objective: Vital Signs: Blood pressure 125/68, pulse 99, temperature 98.8 F (37.1 C), temperature source Oral, resp. rate 18, height 5\' 9"  (1.753 m), weight 83.598 kg (184 lb 4.8 oz), SpO2 100.00%. No results found. No results found for this basename: WBC, HGB, HCT, PLT,  in the last 72 hours No results found for this basename: NA, K, CL, CO, GLUCOSE, BUN, CREATININE, CALCIUM,  in the last 72 hours CBG (last 3)  No results found for this basename: GLUCAP,  in the last 72 hours  Wt Readings from Last 3 Encounters:  06/01/14 83.598 kg (184 lb 4.8 oz)  05/25/14 81.647 kg (180 lb)  05/25/14 81.647 kg (180 lb)    Physical Exam:  General Comments: Pt able to recall precautions this session however continues to to need constant reminders. Pt continues to attempt to use right UE with all mobilty after constant cues of nonweightbearing  Physical Exam:  Blood pressure 133/83, pulse 99, temperature 98.6 F (37 C), temperature source Oral, resp. rate 18, height 5\' 9"  (1.753 m), weight 81.647 kg (180 lb), SpO2 100.00%.  Constitutional: He appears well-developed and well-nourished. He appears alert  Eyes: Pupils reactive to light  Right lid swelling better.   Cardiovascular: Regular rhythm. Tachycardia present.  Lungs /pulmonary .clear to auscultation  Abdomen .soft positive bowel sounds nontender  Neurological: alert. Moves all 4's but right shoulder , legs remained limited proximally due to pain. No gross sensory loss. CN notable for altered vision OD. Fair insight and awareness.  Skin:  Multiple facial abrasions and limb abrasions/areas of eschar    Assessment/Plan: 1. 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 Physiatrist is providing close team supervision and 24 hour management of active medical problems listed below. Physiatrist and rehab team continue to assess barriers to discharge/monitor patient progress toward functional and medical goals.     Pt doing well with weight bearing precautions. May be able to move up discharge  FIM: FIM - Bathing Bathing Steps Patient Completed: Chest;Right Arm;Left Arm;Abdomen;Front perineal area;Buttocks;Right upper leg;Left upper leg;Right lower leg (including foot);Left lower leg (including foot) Bathing: 4: Steadying assist  FIM - Upper Body Dressing/Undressing Upper body dressing/undressing steps patient completed: Thread/unthread right sleeve of pullover shirt/dresss;Thread/unthread left sleeve of pullover shirt/dress;Put head through opening of pull over shirt/dress;Pull shirt over trunk Upper body dressing/undressing: 5: Supervision: Safety issues/verbal cues FIM - Lower Body Dressing/Undressing Lower body dressing/undressing steps patient completed: Pull underwear up/down;Pull pants up/down;Fasten/unfasten pants;Thread/unthread right underwear leg;Thread/unthread left underwear leg;Thread/unthread right pants leg;Thread/unthread left pants leg;Don/Doff right sock;Don/Doff right shoe;Don/Doff left sock;Don/Doff left shoe Lower body dressing/undressing: 4: Steadying Assist  FIM - Toileting Toileting steps completed by patient: Performs perineal hygiene;Adjust clothing prior to toileting;Adjust clothing after toileting Toileting Assistive Devices: Grab bar or rail for support Toileting: 5: Supervision: Safety issues/verbal cues  FIM - Diplomatic Services operational officerToilet Transfers Toilet Transfers Assistive Devices: Grab bars;Walker Toilet Transfers: 5-To toilet/BSC: Supervision (verbal cues/safety issues);5-From toilet/BSC: Supervision (verbal cues/safety issues)  FIM - Press photographerBed/Chair Transfer Bed/Chair Transfer Assistive Devices: Arm  rests;Walker Bed/Chair Transfer: 4: Sit > Supine: Min A (steadying pt. > 75%/lift 1 leg);5: Bed > Chair or W/C: Supervision (verbal cues/safety issues);5: Chair or W/C > Bed: Supervision (verbal  cues/safety issues)  FIM - Locomotion: Wheelchair Distance: 150 Locomotion: Wheelchair: 5: Travels 150 ft or more: maneuvers on rugs and over door sills with supervision, cueing or coaxing FIM - Locomotion: Ambulation Locomotion: Ambulation Assistive Devices: Designer, industrial/productWalker - Rolling Ambulation/Gait Assistance: 5: Supervision Locomotion: Ambulation: 1: Travels less than 50 ft with supervision/safety issues  Comprehension Comprehension Mode: Auditory Comprehension: 5-Follows basic conversation/direction: With no assist  Expression Expression Mode: Verbal Expression: 5-Expresses basic 90% of the time/requires cueing < 10% of the time.  Social Interaction Social Interaction: 5-Interacts appropriately 90% of the time - Needs monitoring or encouragement for participation or interaction.  Problem Solving Problem Solving: 5-Solves basic 90% of the time/requires cueing < 10% of the time  Memory Memory: 4-Recognizes or recalls 75 - 89% of the time/requires cueing 10 - 24% of the time Medical Problem List and Plan:  1. 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 DVT Prophylaxis/Anticoagulation: Subcutaneous Lovenox.  vascular studies pending 3. Pain Management: Oxycodone and Robaxin as needed. Monitor with increased mobility   -?left supra-patellar pain?---observe for now. Consider xr 4. Mood/agitation. Improved. Bed alarm--observe  5. Neuropsych: This patient is capable of making decisions on his own behalf.  6. Acute blood loss anemia. Followup CBC  7. Left acetabular fracture. Status post ORIF. Touchdown weightbearing/posterior hip precautions  8. Right clavicle fracture.  Weightbearing as tolerated range of motion as tolerated with sling for comfort  9. I&D of right calf wound and  I&D with closure left arm wound 05/26/2014. Remove sutures 10. Open globe injury right eye. Repair of eye injuries by Dr. Clarisa KindredGeiger 05/25/2014    LOS (Days) 4 A FACE TO FACE EVALUATION WAS PERFORMED  SWARTZ,ZACHARY T 06/05/2014 8:30 AM

## 2014-06-05 NOTE — Plan of Care (Signed)
Problem: RH Bed Mobility Goal: LTG Patient will perform bed mobility with assist (PT) LTG: Patient will perform bed mobility with assistance, with/without cues (PT).  Upgraded due to improved safety and decreased need for cueing to maintain precautions.   Problem: RH Bed to Chair Transfers Goal: LTG Patient will perform bed/chair transfers w/assist (PT) LTG: Patient will perform bed/chair transfers with assistance, with/without cues (PT).  Upgraded due to improved safety and decreased need for cueing to maintain precautions.   Problem: RH Furniture Transfers Goal: LTG Patient will perform furniture transfers w/assist (OT/PT LTG: Patient will perform furniture transfers with assistance (OT/PT).  Upgraded due to improved safety and decreased need for cueing to maintain precautions.   Problem: RH Ambulation Goal: LTG Patient will ambulate in controlled environment (PT) LTG: Patient will ambulate in a controlled environment, # of feet with assistance (PT).  Upgraded due to improved safety and decreased need for cueing to maintain precautions.  Goal: LTG Patient will ambulate in home environment (PT) LTG: Patient will ambulate in home environment, # of feet with assistance (PT).  Upgraded due to improved safety and decreased need for cueing to maintain precautions.   Problem: RH Wheelchair Mobility Goal: LTG Patient will propel w/c in controlled environment (PT) LTG: Patient will propel wheelchair in controlled environment, # of feet with assist (PT)  Upgraded due to improved safety and decreased need for cueing to manage w/c parts.  Goal: LTG Patient will propel w/c in home environment (PT) LTG: Patient will propel wheelchair in home environment, # of feet with assistance (PT).  Outcome: Adequate for Discharge D/C goal as pt will be ambulatory in home environment.

## 2014-06-05 NOTE — Progress Notes (Signed)
Note reviewed and accurately reflects treatment session.   

## 2014-06-05 NOTE — Progress Notes (Signed)
Occupational Therapy Session Note  Patient Details  Name: Phillip [name] MRN: [id number] Date of Birth: 1981-12-11  Today's Date: 06/05/2014 Time: 0700-0800 Time Calculation (min): 60 min  Short Term Goals: Week 1:  OT Short Term Goal 1 (Week 1): Pt will sustain attention to self-care task ~15 minutes with min cues  OT Short Term Goal 2 (Week 1): Pt will identify 2 current deficits with min cues OT Short Term Goal 3 (Week 1): Pt will demonstrate recall of 2 events from previous therapy  OT Short Term Goal 4 (Week 1): Pt will complete toilet transfer at supervision level   Skilled Therapeutic Interventions/Progress Updates:   Pt seen for ADL retraining with focus on functional transfers, functional mobility, standing balance, cognitive remediation, safety awareness, use of AE, and activity tolerance. Pt received supine in bed and oriented x4. Completed supine>sit and sit>stand using RW with min cues and pt able to retrieve all clothing items. Ambulated to bathroom using RW and performed shower transfer with min cues for precautions demonstrating improved functional mobility and safety awareness. Completed bathing at shower level with pt completing sit<>stand using grab bar at supervision level only requiring min cues today. Pt able to recall two therapy goals with extra time and mod cues and able to recall all precautions with min cues demonstrating improved intellectual awareness. Performed LB dressing task sitting EOB using AE with min cues at supervision level for sit>stand using RW demonstrating appropriate use of techniques and equipment. Pt with improved attention to self-care tasks today and improved awareness of precautions. Ambulated to therapy apartment using RW with supervision, extra time and min cues with pt fatiguing upon arrival. Educated and demonstrated use of tub transfer bench using precautions with pt demonstrating appropriate use with max multimodal cues and extra time to problem  solve. Returned to room via w/c and pt left sitting EOB with bed alarm on and all needs in reach.   Therapy Documentation Precautions:  Precautions Precautions: Fall;Shoulder;Posterior Hip;Other (comment) Type of Shoulder Precautions: R Clavicle fx- weight bearing as tolerated; L LE TDWB Shoulder Interventions: For comfort;Shoulder sling/immobilizer Precaution Booklet Issued: No Precaution Comments: Posterior Hip Prec Handout provided Restrictions Weight Bearing Restrictions: Yes RUE Weight Bearing: Weight bearing as tolerated LLE Weight Bearing: Touchdown weight bearing (uses walker)  Pain:  Pt reporting no pain at this time.   ADL:   See FIM for current functional status  Therapy/Group: Individual Therapy  Spake,Lauren Brooke 06/05/2014, 9:16 AM

## 2014-06-05 NOTE — Progress Notes (Signed)
Physical Therapy Session Note  Patient Details  Name: Phillip [name] MRN: [id number] Date of Birth: Jun 25, 1981  Today's Date: 06/05/2014 Time: Treatment Session 1: 0900-1000; Treatment Session 2: [id number] Time Calculation (min): Treatment Session 1: 60 min; Treatment Session 2: 60min  Short Term Goals: Week 1:  PT Short Term Goal 1 (Week 1): Pt to perform bed mobility w/ supervision, 50% of time and min cueing for safety PT Short Term Goal 2 (Week 1): Pt to independently verbalize all mobility precautions PT Short Term Goal 3 (Week 1): Pt to t/f bed<>w/c w/ supervision, 50% of time and min cueing for safety PT Short Term Goal 4 (Week 1): Pt to ambulate 25' w/ use of hemi-walker and min guard A, min cueing for safety PT Short Term Goal 5 (Week 1): Pt to negotiate up/down 2 steps w/ B rail and min A, min cueing for safety  Skilled Therapeutic Interventions/Progress Updates:  Treatment Session 1:  1:1. Pt received supine in bed, ready for therapy. RN present at start of session to provide medicine, pt declined need for pain medicine at this time. Focus this session on functional transfers, functional endurance, ambulation, B LE therex and education. Pt able to perform bed mobility and multiple t/f sit<>stand, intermittent cueing for improved positioning to maintain precautions as well as improved comfort. Pt amb 200'x2 on unit and 250'x2 off unit in hospital lobby and outside on uneven surfaces w/ overall (S) and maintaining TDWB w/out cueing. Pt demonstrated difficulty dual tasking amb and verbalizing precautions, req min A. Pt propelling w/c 200'x2 w/ B UE when not ambulating for energy conservation in B LE and to target functional endurance.  Pt performed therex to target B LE strength, exercises included 3x10 reps of: marching (seated and standing w/ L LE only, LAQ in sitting; hip abd in standing. Pt in bathroom at end of session, nurse tech in room to provide (S).   LTGs upgraded due to  improved overall safety and emergent awareness to maintain precautions during functional mobility. Pt educated regarding upgraded mod(I) mobility goals w/ use of RW in home environment, but recommendation for intermittent supervision (confirmed by OT and SLP) in home for overall safety and residual cognitive impairments as well as recommendation for supervision during mobility in community environments for overall safety. Pt verbalized understanding.   Treatment Session 2:  1:1. Pt received semi-reclined in bed, ready for therapy. Focus this session on kitchen management, functional transfers and functional endurance. Pt amb 225'x1 and standing throughout cooking task in therapy kitchen to target functional endurance w/ use of RW and overall (S). Pt demonstrated excellent ability to maintain TDWB w/out cues. Pt req intermittent min cues for problem solving management of cooking utensils to make jello. Pt req overall (S) for safe performance of car transfer w/ ability to problem solve management of L LE into car w/out cueing. Pt propelled w/c 150'x1 at end of session w/ distant (S) and management of w/c parts w/ intermittent min cueing. Pt supine in bed at end of session w/ all needs in reach.   Therapy Documentation Precautions:  Precautions Precautions: Fall;Shoulder;Posterior Hip;Other (comment) Type of Shoulder Precautions: R Clavicle fx- weight bearing as tolerated; L LE TDWB Shoulder Interventions: For comfort;Shoulder sling/immobilizer Precaution Booklet Issued: No Precaution Comments: Posterior Hip Prec Handout provided Restrictions Weight Bearing Restrictions: Yes RUE Weight Bearing: Weight bearing as tolerated LLE Weight Bearing: Touchdown weight bearing (uses walker)  See FIM for current functional status  Therapy/Group: Individual Therapy  Zerita BoersKing, Caroline  S 06/05/2014, 12:07 PM

## 2014-06-05 NOTE — Progress Notes (Signed)
Speech Language Pathology Daily Session Note  Patient Details  Name: Phillip [name] MRN: [id number] Date of Birth: 07/06/1981  Today's Date: 06/05/2014 Time: [id number] Time Calculation (min): 42 min  Short Term Goals: Week 1: SLP Short Term Goal 1 (Week 1): Patient will recall and utilize weight bearing restrictions with Supervision cues SLP Short Term Goal 2 (Week 1): Patient will solve complex problems realted to home management with Supervision cues SLP Short Term Goal 3 (Week 1): Patient will recall new information with Supervision cues SLP Short Term Goal 4 (Week 1): Patient will alternate attention for 10 minutes with Supervision cues for redirection  Skilled Therapeutic Interventions: Skilled treatment focused on cognitive goals. SLP facilitated session with review of precautions, which pt verbalized with Mod I with extra time. Pt performed sit>stand transfer with RW utilizing precautions with supervision. Pt navigated the unit to find where he could get a cup of coffee, asking staff members for directions with Mod I, although requiring Min cues for problem solving to utilize environmental cues. Pt required extra time and supervision level cueing to verbalize precautions while walking and navigating back to his room. Continue plan of care.   FIM:  Comprehension Comprehension Mode: Auditory Comprehension: 6-Follows complex conversation/direction: With extra time/assistive device Expression Expression Mode: Verbal Expression: 6-Expresses complex ideas: With extra time/assistive device Social Interaction Social Interaction: 6-Interacts appropriately with others with medication or extra time (anti-anxiety, antidepressant). Problem Solving Problem Solving: 5-Solves basic problems: With no assist Memory Memory: 5-Recognizes or recalls 90% of the time/requires cueing < 10% of the time FIM - Eating Eating Activity: 6: More than reasonable amount of time  Pain Pain Assessment Pain  Assessment: No/denies pain  Therapy/Group: Individual Therapy   Maxcine HamLaura Paiewonsky, M.A. CCC-SLP [phone]  Maxcine Hamaiewonsky, Phillip [name] 06/05/2014, 3:42 PM

## 2014-06-05 NOTE — Progress Notes (Signed)
Physical Therapy Session Note  Patient Details  Name: Phillip [name] MRN: [id number] Date of Birth: Oct 18, 1981  Today's Date: 06/05/2014 Time: [id number] Time Calculation (min): 42 min  Short Term Goals: Week 1:  PT Short Term Goal 1 (Week 1): Pt to perform bed mobility w/ supervision, 50% of time and min cueing for safety PT Short Term Goal 2 (Week 1): Pt to independently verbalize all mobility precautions PT Short Term Goal 3 (Week 1): Pt to t/f bed<>w/c w/ supervision, 50% of time and min cueing for safety PT Short Term Goal 4 (Week 1): Pt to ambulate 25' w/ use of hemi-walker and min guard A, min cueing for safety PT Short Term Goal 5 (Week 1): Pt to negotiate up/down 2 steps w/ B rail and min A, min cueing for safety  Skilled Therapeutic Interventions/Progress Updates:   Pt resting in bed.  Pt performed supine > sit maintaining posterior hip precautions with supervision on flat bed, no rail.  Pt performed transfer to/from toilet with RW supervision and performed all toileting tasks with supervision.  Performed gait x 150' with RW and supervision while maintaining TDWB LLE.  In gym pt performed general endurance and strengthening on Nustep at level 7 x 8 minutes with bilat UE and RLE with two rest breaks secondary to discomfort in RUE.  Pt reporting at home he has a two year old that keeps toys all over the floor.  Set up obstacle course where pt was required to problem solve ambulating between cones forwards or side stepping to L and R, hopping over low obstacles on floor and using reaching to pick up obstacles on floor to clear path; pt performed with supervision.  Performed stair negotiation up/down 5 stairs x 2 reps hopping with 2 rails with supervision.  Returned to room in w/c with supervision and transferred back to EOB with supervision.     Therapy Documentation Precautions:  Precautions Precautions: Fall;Shoulder;Posterior Hip;Other (comment) Type of Shoulder Precautions: R  Clavicle fx- weight bearing as tolerated; L LE TDWB Shoulder Interventions: For comfort;Shoulder sling/immobilizer Precaution Booklet Issued: No Precaution Comments: Posterior Hip Prec Handout provided Restrictions Weight Bearing Restrictions: Yes RUE Weight Bearing: Weight bearing as tolerated LLE Weight Bearing: Touchdown weight bearing (uses walker) Pain: Pain Assessment Pain Assessment: No/denies pain Locomotion : Ambulation Ambulation/Gait Assistance: 5: Supervision Wheelchair Mobility Distance: 150   See FIM for current functional status  Therapy/Group: Individual Therapy  Edman CircleHall, Audra [facility] Hospital[facility]aucette 06/05/2014, 12:35 PM

## 2014-06-06 ENCOUNTER — Inpatient Hospital Stay (HOSPITAL_COMMUNITY): Payer: Self-pay | Admitting: Occupational Therapy

## 2014-06-06 ENCOUNTER — Inpatient Hospital Stay (HOSPITAL_COMMUNITY): Payer: No Typology Code available for payment source | Admitting: Occupational Therapy

## 2014-06-06 ENCOUNTER — Inpatient Hospital Stay (HOSPITAL_COMMUNITY): Payer: No Typology Code available for payment source | Admitting: Speech Pathology

## 2014-06-06 ENCOUNTER — Inpatient Hospital Stay (HOSPITAL_COMMUNITY): Payer: No Typology Code available for payment source | Admitting: Physical Therapy

## 2014-06-06 DIAGNOSIS — S32409A Unspecified fracture of unspecified acetabulum, initial encounter for closed fracture: Secondary | ICD-10-CM

## 2014-06-06 NOTE — Progress Notes (Signed)
Patient ID: Phillip BargeBrian D [name], male   DOB: January 10, 1981, 33 y.o.   MRN: [id number]    [facility] PHYSICAL MEDICINE & REHABILITATION     PROGRESS NOTE   06/06/14.  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 Continues to progress; anticipating d/c on Tuesday. A  review of systems has been performed and if not noted above is otherwise negative.   Objective:   Intake/Output Summary (Last 24 hours) at 06/06/14 0957 Last data filed at 06/06/14 0503  Gross per 24 hour  Intake    720 ml  Output    725 ml  Net     -5 ml    Patient Vitals for the past 24 hrs:  BP Temp Temp src Pulse Resp SpO2  06/06/14 0504 114/76 mmHg 98 F (36.7 C) Oral 85 17 99 %  06/05/14 2023 127/71 mmHg 97.7 F (36.5 C) Oral 101 17 100 %  06/05/14 1449 132/81 mmHg 98.6 F (37 C) Oral 105 17 100 %    Physical Exam:  Alert and appropriate Physical Exam:  Blood pressure 133/83, pulse 99, temperature 98.6 F (37 C), temperature source Oral, resp. rate 18, height 5\' 9"  (1.753 m), weight 81.647 kg (180 lb), SpO2 100.00%.  Constitutional: He appears well-developed and well-nourished. He appears alert  Eyes: Pupils reactive to light  Right lid swelling better.  Sutures still present R eye Cardiovascular: Regular rhythm.  Lungs /pulmonary .clear to auscultation  Abdomen .soft positive bowel sounds nontender  Neurological: alert. Moves all 4's but right shoulder , legs remained limited proximally due to pain. No gross sensory loss. CN notable for altered vision OD. Fair insight and awareness.      Assessment/Plan: Medical Problem List and Plan:  1. 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 DVT Prophylaxis/Anticoagulation: Subcutaneous Lovenox.    3. Neuropsych: This patient is capable of making decisions on his own behalf.  4. Acute blood loss anemia. Followup CBC  5. Left acetabular fracture. Status post ORIF.  Touchdown weightbearing/posterior hip precautions  6. Right clavicle fracture.  Weightbearing as tolerated range of motion as tolerated with sling for comfort  7.  I&D of right calf wound and I&D with closure left arm wound 05/26/2014.  8. Open globe injury right eye. Repair of eye injuries by Dr. Clarisa KindredGeiger 05/25/2014    LOS (Days) 5 A FACE TO FACE EVALUATION WAS PERFORMED  Rogelia BogaKWIATKOWSKI,PETER FRANK 06/06/2014 9:55 AM

## 2014-06-06 NOTE — Progress Notes (Addendum)
Occupational Therapy Session Note  Patient Details  Name: Phillip BargeBrian D Buckwalter MRN: [id number] Date of Birth: 1980-12-19  Today's Date: 06/06/2014 Time:1030-1115 Time calculation=45 minutes  Short Term Goals: Week 1:  OT Short Term Goal 1 (Week 1): Pt will sustain attention to self-care task ~15 minutes with min cues  OT Short Term Goal 2 (Week 1): Pt will identify 2 current deficits with min cues OT Short Term Goal 3 (Week 1): Pt will demonstrate recall of 2 events from previous therapy  OT Short Term Goal 4 (Week 1): Pt will complete toilet transfer at supervision level   First session [phone]: ADL in room shower with focus on demonstrating competence for adhering to Left total hip precautions & LLE TDWB.   Able to complete functional mobility bed to/fr shower and stand in shower and complete LB dressing via reacher, long handled shoe horn and sock aide in order to adhere to precautions.      Patient complained that his right "clavicle" felt inflammed and hurt if he elevated his right shoulder  Second Session: missed due to complaints that right clavicle hurt with inflammation and needed a rest, less he was afraid he would further injure it.   Staff applied ice pack to his shoulder     Therapy Documentation Precautions:  Precautions Precautions: Fall;Shoulder;Posterior Hip;Other (comment) Type of Shoulder Precautions: R Clavicle fx- weight bearing as tolerated; L LE TDWB Shoulder Interventions: For comfort;Shoulder sling/immobilizer Precaution Booklet Issued: No Precaution Comments: Posterior Hip Prec Handout provided Restrictions Weight Bearing Restrictions: Yes RUE Weight Bearing: Weight bearing as tolerated LLE Weight Bearing: Touchdown weight bearing General: General Amount of Missed OT Time (min): 30 Minutes (right shoulder inflammed)  Pain:  "My right collar bone is sore from hiking up my shoulder" but patient did not rate.   Elected not to take pain meds but instead allowed  staff to apply ice pack to decrease what he described as 'inflammation from moving it so much and the wrong way'  See FIM for current functional status  Therapy/Group: Individual Therapy  Bud Faceickett, Queenie Aufiero [facility] 06/06/2014, 2:47 PM

## 2014-06-06 NOTE — Progress Notes (Signed)
Speech Language Pathology Daily Session Note  Patient Details  Name: Phillip [name] MRN: [id number] Date of Birth: [birth year]  Today's Date: 06/06/2014 Time: 1500-1530 Time Calculation (min): 30 min  Short Term Goals: Week 1: SLP Short Term Goal 1 (Week 1): Patient will recall and utilize weight bearing restrictions with Supervision cues SLP Short Term Goal 2 (Week 1): Patient will solve complex problems realted to home management with Supervision cues SLP Short Term Goal 3 (Week 1): Patient will recall new information with Supervision cues SLP Short Term Goal 4 (Week 1): Patient will alternate attention for 10 minutes with Supervision cues for redirection  Skilled Therapeutic Interventions: Skilled ST intervention provided with focus on cognitive goals. Pt seen in room for ST treatment session. Pt oriented x4 and noted with appropriate attention throughout tx activities. Pt solved 6-step sequencing tasks with 100% accuracy min A. Pt recalled safety precautions and restrictions with 100% accuracy.    FIM:  Comprehension Comprehension Mode: Auditory Comprehension: 7-Follows complex conversation/direction: With no assist Expression Expression Mode: Verbal Expression: 7-Expresses complex ideas: With no assist Social Interaction Social Interaction: 6-Interacts appropriately with others with medication or extra time (anti-anxiety, antidepressant). Problem Solving Problem Solving: 6-Solves complex problems: With extra time Memory Memory: 5-Requires cues to use assistive device FIM - Eating Eating Activity: 6: More than reasonable amount of time  Pain Pain Assessment Pain Assessment: No/denies pain Pain Score: 7  Pain Location: Arm (R) Pain Intervention(s): Repositioned;Other (Comment) (manual techniques, neuro re-ed, graded activity and pain education)  Therapy/Group: Individual Therapy  McMasters, Kara PacerLeah N 06/06/2014, 3:43 PM

## 2014-06-06 NOTE — Progress Notes (Signed)
Physical Therapy Session Note  Patient Details  Name: Phillip [name] MRN: [id number] Date of Birth: 11-08-1981  Today's Date: 06/06/2014 Time: 1302-1400 Time Calculation (min): 58 min  Short Term Goals: Week 1:  PT Short Term Goal 1 (Week 1): Pt to perform bed mobility w/ supervision, 50% of time and min cueing for safety PT Short Term Goal 2 (Week 1): Pt to independently verbalize all mobility precautions PT Short Term Goal 3 (Week 1): Pt to t/f bed<>w/c w/ supervision, 50% of time and min cueing for safety PT Short Term Goal 4 (Week 1): Pt to ambulate 25' w/ use of hemi-walker and min guard A, min cueing for safety PT Short Term Goal 5 (Week 1): Pt to negotiate up/down 2 steps w/ B rail and min A, min cueing for safety  Skilled Therapeutic Interventions/Progress Updates:   Pt received sitting EOB finishing meal. Pt at overall supervision level for w/c mobility 2 x 150 ft and using RW for transfers and short distance ambulation, limited gait due to increase in R shoulder pain (unrated) over last several days secondary to overuse with ambulation using RW. Pt able to appropriately teach back postural and UE positioning education from earlier therapy session as well as recall cervical stretching for HEP. Pt performed cervical stretching x 60 sec each stretch biased for upper trap, levator scapulae, and scalenes. Pt performed BLE strengthening: standing using RW for UE support - RLE heel raises and RLE 3/4 squats, sitting EOM - quad sets with 5 sec hold, LAQ, and hip flexion (maintaining posterior precautions), supine with bolster under knees - bridging and SAQ, 2 sets of 12-20 reps each exercise. Pt returned to room and left semi reclined in bed, ice pack applied to R clavicle, with all needs within reach.   Therapy Documentation Precautions:  Precautions Precautions: Fall;Shoulder;Posterior Hip;Other (comment) Type of Shoulder Precautions: R Clavicle fx- weight bearing as tolerated; L LE  TDWB Shoulder Interventions: For comfort;Shoulder sling/immobilizer Precaution Booklet Issued: No Precaution Comments: Posterior Hip Prec Handout provided Restrictions Weight Bearing Restrictions: Yes RUE Weight Bearing: Weight bearing as tolerated LLE Weight Bearing: Touchdown weight bearing  See FIM for current functional status  Therapy/Group: Individual Therapy  Kerney ElbeVarner, Rebecca A 06/06/2014, 3:10 PM

## 2014-06-06 NOTE — Progress Notes (Signed)
Physical Therapy Session Note  Patient Details  Name: Phillip [name] MRN: [id number] Date of Birth: 1981/11/05  Today's Date: 06/06/2014 Time: 1117-1203 Time Calculation (min): 46 min  Short Term Goals: Week 1:  PT Short Term Goal 1 (Week 1): Pt to perform bed mobility w/ supervision, 50% of time and min cueing for safety PT Short Term Goal 2 (Week 1): Pt to independently verbalize all mobility precautions PT Short Term Goal 3 (Week 1): Pt to t/f bed<>w/c w/ supervision, 50% of time and min cueing for safety PT Short Term Goal 4 (Week 1): Pt to ambulate 25' w/ use of hemi-walker and min guard A, min cueing for safety PT Short Term Goal 5 (Week 1): Pt to negotiate up/down 2 steps w/ B rail and min A, min cueing for safety  Skilled Therapeutic Interventions/Progress Updates:    Pt benefits in session from cues for ventilation (inspiration with shoulder flexion and scap retraction) for decreased pain and increased ROM. Pt defers further gait training in session secndary to increased RUE pain at this time. Pt with some breath holding noted in RLE and LUE sit to stands that improves with cues, but indicative of patient's strength and motor control issues. Pt would continue to benefit from skilled PT services to increase functional mobility.  Therapy Documentation Precautions:  Precautions Precautions: Fall;Shoulder;Posterior Hip;Other (comment) Type of Shoulder Precautions: R Clavicle fx- weight bearing as tolerated; L LE TDWB Shoulder Interventions: For comfort;Shoulder sling/immobilizer Precaution Booklet Issued: No Precaution Comments: Posterior Hip Prec Handout provided Restrictions Weight Bearing Restrictions: Yes RUE Weight Bearing: Weight bearing as tolerated LLE Weight Bearing: Touchdown weight bearing Pain: Pain Assessment Pain Score: 7  Pain Location: Arm (R) Pain Intervention(s): Repositioned;Other (Comment) (manual techniques, neuro re-ed, graded activity and pain  education) Mobility:  Pt supervision for all transfers with occasional cueing for weight bearing status Locomotion : Ambulation Ambulation/Gait Assistance: 5: Supervision Wheelchair Mobility Distance: 150  And pt performs 5 steps with cues for ventilation MinGuard with B/L rails   Other Treatments:  RLE and LUE sit to stands 4x5 with cues for weight shift, LE placement, sequencing, ventilation. Pt performs RUE elevation in flexion 2x10 with cues for posture and ventilation. Pt performs scap retraction 2x10 with cues for ventilation. Thoracic NAGs and UT release performed. Pt educated on pain science and movement reeducation. Pt educated on maintaining weight bearing precautions during transitions.  See FIM for current functional status  Therapy/Group: Individual Therapy  Christia ReadingKinney, Steven G 06/06/2014, 2:37 PM

## 2014-06-07 ENCOUNTER — Inpatient Hospital Stay (HOSPITAL_COMMUNITY): Payer: No Typology Code available for payment source | Admitting: Physical Therapy

## 2014-06-07 NOTE — Progress Notes (Signed)
Physical Therapy Session Note  Patient Details  Name: Phillip BargeBrian D Verhoeven MRN: [id number] Date of Birth: October 28, 1981  Today's Date: 06/07/2014 Time: 0900-1000 Time Calculation (min): 60 min  Short Term Goals: Week 1:  PT Short Term Goal 1 (Week 1): Pt to perform bed mobility w/ supervision, 50% of time and min cueing for safety PT Short Term Goal 2 (Week 1): Pt to independently verbalize all mobility precautions PT Short Term Goal 3 (Week 1): Pt to t/f bed<>w/c w/ supervision, 50% of time and min cueing for safety PT Short Term Goal 4 (Week 1): Pt to ambulate 25' w/ use of hemi-walker and min guard A, min cueing for safety PT Short Term Goal 5 (Week 1): Pt to negotiate up/down 2 steps w/ B rail and min A, min cueing for safety  Skilled Therapeutic Interventions/Progress Updates:   Pt received semi reclined in bed, agreeable to therapy. Per patient, ice pack yesterday with no improvement in pain due to not being cold enough. Pt has asked family member to bring in gel pack. Pt performed stand pivot transfers and short distance ambulation due to R shoulder pain using RW in controlled and home environments at supervision level. W/c propulsion using BUEs 2 x 150 ft, supervision. Seated EOM, pt performed stretching for upper trap, levator scapulae, and scalenes, 2 x 30-45 sec each. Pt provided with HEP of stretching. Performed trigger point release to patient's upper trap and rhomboids, educated patient that girlfriend can assist with deep tissue massage to decrease soft tissue restrictions and tenderness in thoracic/lower neck region. Pt verbalized understanding. Pt performed LE strengthening, 12-20 reps x 2 sets each exercise: single leg squats with pillowcase under LLE to decrease friction, RLE heel raises using RW, LAQ, seated hip flex to 90 deg (on raised mat to maintain precautions), supine with bolster under knees-bridging and SAQ, supine heel slides with maxi slide. Pt returned to room and left sitting  EOB with all needs within reach.   Therapy Documentation Precautions:  Precautions Precautions: Fall;Shoulder;Posterior Hip;Other (comment) Type of Shoulder Precautions: R Clavicle fx- weight bearing as tolerated; L LE TDWB Shoulder Interventions: For comfort;Shoulder sling/immobilizer Precaution Booklet Issued: No Precaution Comments: Posterior Hip Prec Handout provided Restrictions Weight Bearing Restrictions: Yes RUE Weight Bearing: Weight bearing as tolerated LLE Weight Bearing: Touchdown weight bearing Pain:  Unrated, sharp R shoulder pain with certain movements that resolves with rest and repositioning  See FIM for current functional status  Therapy/Group: Individual Therapy  Kerney ElbeVarner, Kailash Hinze A 06/07/2014, 10:03 AM

## 2014-06-07 NOTE — Progress Notes (Signed)
Patient ID: Phillip [name], male   DOB: 1981/04/20, 33 y.o.   MRN: [id number]   Patient ID: Phillip BargeBrian D [name], male   DOB: 1981/04/20, 33 y.o.   MRN: [id number]    [facility] PHYSICAL MEDICINE & REHABILITATION     PROGRESS NOTE   06/06/14.  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 Continues to progress; anticipating d/c on Tuesday. A  review of systems has been performed and if not noted above is otherwise negative.   Objective:   Intake/Output Summary (Last 24 hours) at 06/07/14 0853 Last data filed at 06/06/14 1800  Gross per 24 hour  Intake    840 ml  Output    300 ml  Net    540 ml    Patient Vitals for the past 24 hrs:  BP Temp Temp src Pulse Resp SpO2  06/07/14 0534 109/70 mmHg 97 F (36.1 C) Oral 86 16 99 %  06/06/14 2114 134/78 mmHg 98.2 F (36.8 C) Oral 85 16 97 %  06/06/14 1446 131/82 mmHg 97.7 F (36.5 C) Oral 99 16 100 %    Physical Exam:  Alert and appropriate Physical Exam:  Blood pressure 133/83, pulse 99, temperature 98.6 F (37 C), temperature source Oral, resp. rate 18, height 5\' 9"  (1.753 m), weight 81.647 kg (180 lb), SpO2 100.00%.  Constitutional: He appears well-developed and well-nourished. He appears alert  Eyes: Pupils reactive to light  Right lid swelling better.  Sutures still present R eye Cardiovascular: Regular rhythm.  Lungs /pulmonary .clear to auscultation  Abdomen .soft positive bowel sounds nontender  Neurological: alert. Moves all 4's but right shoulder , legs remained limited proximally due to pain. No gross sensory loss. CN notable for altered vision OD. Fair insight and awareness.      Assessment/Plan: Medical Problem List and Plan:  1. 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 DVT Prophylaxis/Anticoagulation: Subcutaneous Lovenox.    3. Neuropsych: This patient is capable of making decisions on his own behalf.  4. Acute  blood loss anemia. Followup CBC  5. Left acetabular fracture. Status post ORIF. Touchdown weightbearing/posterior hip precautions  6. Right clavicle fracture.  Weightbearing as tolerated range of motion as tolerated with sling for comfort  7.  I&D of right calf wound and I&D with closure left arm wound 05/26/2014.  8. Open globe injury right eye. Repair of eye injuries by Dr. Clarisa KindredGeiger 05/25/2014    LOS (Days) 6 A FACE TO FACE EVALUATION WAS PERFORMED  Rogelia BogaKWIATKOWSKI,PETER FRANK 06/07/2014 8:53 AM

## 2014-06-08 ENCOUNTER — Encounter (HOSPITAL_COMMUNITY): Payer: Self-pay

## 2014-06-08 ENCOUNTER — Inpatient Hospital Stay (HOSPITAL_COMMUNITY): Payer: Self-pay

## 2014-06-08 ENCOUNTER — Inpatient Hospital Stay (HOSPITAL_COMMUNITY): Payer: No Typology Code available for payment source | Admitting: Speech Pathology

## 2014-06-08 DIAGNOSIS — S069XAA Unspecified intracranial injury with loss of consciousness status unknown, initial encounter: Secondary | ICD-10-CM

## 2014-06-08 DIAGNOSIS — S32409A Unspecified fracture of unspecified acetabulum, initial encounter for closed fracture: Secondary | ICD-10-CM

## 2014-06-08 DIAGNOSIS — S069X9A Unspecified intracranial injury with loss of consciousness of unspecified duration, initial encounter: Secondary | ICD-10-CM

## 2014-06-08 DIAGNOSIS — S42009A Fracture of unspecified part of unspecified clavicle, initial encounter for closed fracture: Secondary | ICD-10-CM

## 2014-06-08 DIAGNOSIS — T07XXXA Unspecified multiple injuries, initial encounter: Secondary | ICD-10-CM

## 2014-06-08 LAB — CBC
HCT: 30.8 % — ABNORMAL LOW (ref 39.0–52.0)
Hemoglobin: 10 g/dL — ABNORMAL LOW (ref 13.0–17.0)
MCH: 29.7 pg (ref 26.0–34.0)
MCHC: 32.5 g/dL (ref 30.0–36.0)
MCV: 91.4 fL (ref 78.0–100.0)
Platelets: 433 10*3/uL — ABNORMAL HIGH (ref 150–400)
RBC: 3.37 MIL/uL — ABNORMAL LOW (ref 4.22–5.81)
RDW: 13.3 % (ref 11.5–15.5)
WBC: 10.7 10*3/uL — ABNORMAL HIGH (ref 4.0–10.5)

## 2014-06-08 LAB — CREATININE, SERUM
CREATININE: 0.82 mg/dL (ref 0.50–1.35)
GFR calc Af Amer: >90 mL/min (ref >90)
GFR calc non Af Amer: >90 mL/min (ref >90)
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
## 2014-06-08 NOTE — Progress Notes (Signed)
Speech Language Pathology Discharge Summary  Patient Details  Name: Phillip [name] MRN: [id number] Date of Birth: 02-Feb-1981  Today's Date: 06/08/2014 Time: 1400-1500 Time Calculation (min): 60 min  Skilled Therapeutic Interventions:  Skilled treatment session focused on addressing cognition goals and final education.  SLP facilitated session with moderately complex daily math calculations which patient completed with Mod I.  Patient required Supervision cues to anticipate how to track frequency of pain medications following discharge from hospital.  SLP also facilitated session with a new learning task and Supervision cues to utilize working memory strategies for procedures of task; with repetition patient Mod I at end of session.  SLP discussed home recommendations and compensatory strategies for recall and organization; patient provided with handout.  Continue plan of care.  Patient has met 4 of 4 long term goals.  Patient to discharge at overall Supervision level.  Reasons goals not met: n/a   Clinical Impression/Discharge Summary:  Patient has made functional gains and has met 4 of 4 long term goals this admission due to improved cognition. Patient is currently a Rancho Level VIII emerging IX.  He requires overall Supervision assist for complex cognitive tasks such as medication management.  Patient and family education completed and patient will discharge home with necessary level of assist from family.  It is suspected that assist can be faded over 1-2 weeks and as a result no further skilled SLP services are warranted at this time.  Patient is aware of SLP services as an outpatient option of he continues to require assist with complex tasks after ~2 weeks.     Care Partner:  Caregiver Able to Provide Assistance: Yes  Type of Caregiver Assistance: Physical;Cognitive  Recommendation:  Other (comment) (intermittent supervision with complex tasks)     Equipment: none   Reasons for  discharge: Treatment goals met;Discharged from hospital   Patient/Family Agrees with Progress Made and Goals Achieved: Yes   See FIM for current functional status  Carmelia Roller., CCC-SLP [phone]  [place] 06/08/2014, 5:19 PM

## 2014-06-08 NOTE — Progress Notes (Signed)
Physical Therapy Discharge Summary  Patient Details  Name: Phillip [name] MRN: [id number] Date of Birth: July 11, 1981  Today's Date: 06/08/2014 Time: Treatment Session 1: 1100-1155; Treatment Session 2: 1530-1600 Time Calculation (min): Treatment Session 1: 55 min; Treatment Session 2: 74mn  Patient has met 14 of 14 long term goals due to improved activity tolerance, improved balance, increased strength, increased range of motion, decreased pain, ability to compensate for deficits, increased functional use of  right upper extremity and left lower extremity, improved attention, improved awareness and improved coordination. Pt exhibits behaviors consistent w/ Rancho level VIII. Patient to discharge at an ambulatory level mod(I) for household mobility, but recommended supervision for mobility in community environment as well as intermittent supervision at home environment due to residual cognitive impairments. Pt's significant other is aware of pt's current level of functional mobility as well as residual cognitive impairments and is able to provide the necessary intermittent supervision recommended at discharge.   Reasons goals not met: N/A  Recommendation:  Patient will benefit from ongoing skilled PT services in outpatient setting to continue to advance safe functional mobility, address ongoing impairments in decreased functional endurance, decreased dynamic standing balance, decreased strength in R UE/L LE, posterior precautions limiting ROM in L LE, TDWB status limiting functional use of L LE, decreased intellectual/emergent awareness, decreased selective attention, decreased overall functional mobility, and minimize fall risk.  Equipment: RConservation officer, nature Reasons for discharge: treatment goals met and discharge from hospital  Patient/family agrees with progress made and goals achieved: Yes  Skilled Therapeutic Interventions Treatment Session 1: 1:1. Pt received in bathroom, made mod(I)  earlier in day by OT. Focus this session on safety during functional mobility and education regarding d/c planning. Pt able to safely demonstrate all mobility at target goal levels. Pt propelled w/c x300'x2 on/off unit to target B UE strength and general endurance w/ good tolerance. Pt w/ good ability to recall and excellent emergent awareness of all precautions during functional mobility without cueing. Pt educated regarding residual cognitive impairments noted by both therapists and family at this time, specifically relating to attention and memory and how they would impact safety at home and eventually work. Pt initially defensive when discussing cognitive impairments due to decreased intellectual awareness. Pt further educated on d/c process, recommendation for intermittent supervision in home environment due to cognitive impairments and goals of OP PT, pt verbalized understanding. Pt left sitting EOB w/ all needs in reach, mod (I) in room.    Therapist speaking by phone to pt's girlfriend per her request regarding concerns of pt's residual cognitive impairments. Per girlfriend's report that pt texted her following our tx session expressing his frustration that girlfriend "spoke with medical staff behind his back." Girlfriend appropriately worried about pt safety and receptiveness to help at home due to decreased intellectual awareness of residual cognitive deficits. Education provided regarding emphasis on pt's intellectual awareness during tx sessions by all therapies. Girlfriend also stating that pt was now requesting to d/c to mother's home vs. Home w/ girlfriend. SW aware and to follow up to confirm d/c plan and recommendations.   Treatment Session 2:  1:1. Pt received semi-reclined in bed, ready for therapy. Pt amb mod(I) room<>therapy gym w/ RW. In gym, emphasis on HEP with handout provided regarding neck ROM and B LE strengthening exercises. Pt able to accurately demonstrate 1-2 reps of each w/ min  cues regarding correction of technique. Reviewed safety recommendations for home as well as goals of OP PT, pt verbalized understanding. Pt  w/ complaints of R eye irritation and progressing headache at end of session, RN aware and present to address at end of session. Pt left sitting EOB.   PT Discharge Precautions/Restrictions Precautions Precautions: Fall;Shoulder;Posterior Hip;Other (comment) Type of Shoulder Precautions: R Clavicle fx- weight bearing as tolerated; L LE TDWB Shoulder Interventions: For comfort;Shoulder sling/immobilizer Precaution Comments: Posterior Hip Prec Handout provided Restrictions Weight Bearing Restrictions: Yes RUE Weight Bearing: Weight bearing as tolerated LLE Weight Bearing: Touchdown weight bearing Vital Signs Therapy Vitals Temp: 98.1 F (36.7 C) Temp src: Oral Pulse Rate: 90 Resp: 18 BP: 132/83 mmHg Patient Position (if appropriate): Lying Oxygen Therapy SpO2: 100 % O2 Device: None (Room air) Pain   Vision/Perception     Cognition Overall Cognitive Status: Impaired/Different from baseline Arousal/Alertness: Awake/alert Orientation Level: Oriented X4 Attention: Selective Sustained Attention: Appears intact Selective Attention: Impaired Selective Attention Impairment: Verbal basic;Functional basic Memory: Impaired Memory Impairment: Decreased short term memory;Decreased recall of new information Awareness: Impaired Awareness Impairment: Intellectual impairment;Emergent impairment;Anticipatory impairment Problem Solving: Impaired Problem Solving Impairment: Functional basic Behaviors: Impulsive Rancho Los Amigos Scales of Cognitive Functioning: Purposeful/appropriate Sensation Sensation Light Touch: Appears Intact Stereognosis: Appears Intact Hot/Cold: Appears Intact Proprioception: Appears Intact Coordination Gross Motor Movements are Fluid and Coordinated: Yes Fine Motor Movements are Fluid and Coordinated: Yes Motor   Motor Motor: Within Functional Limits  Mobility Bed Mobility Bed Mobility: Supine to Sit;Sit to Supine Supine to Sit: 6: Modified independent (Device/Increase time) Sit to Supine: 6: Modified independent (Device/Increase time) Transfers Transfers: Yes Sit to Stand: 6: Modified independent (Device/Increase time) Stand to Sit: 6: Modified independent (Device/Increase time) Stand Pivot Transfers: 6: Modified independent (Device/Increase time) Locomotion  Ambulation Ambulation: Yes Ambulation/Gait Assistance: 6: Modified independent (Device/Increase time) Ambulation Distance (Feet): 200 Feet Assistive device: Rolling walker Ambulation/Gait Assistance Details: Pt able to consistently maintain L LE TDWB status w/out cueing Gait Gait: Yes Gait Pattern: Impaired Gait Pattern:  (hopping on R LE) Gait velocity: very slow Stairs / Additional Locomotion Stairs: Yes Stairs Assistance: 5: Supervision Stairs Assistance Details: Verbal cues for precautions/safety Stairs Assistance Details (indicate cue type and reason): intermittent cues to slow down for increased safety Stair Management Technique: Two rails;Forwards;Other (comment) (hopping on R LE) Number of Stairs: 5 Wheelchair Mobility Wheelchair Mobility: Yes Wheelchair Assistance: 6: Modified independent (Device/Increase time) Environmental health practitioner: Both upper extremities Wheelchair Parts Management: Independent Distance: 200'  Trunk/Postural Assessment  Cervical Assessment Cervical Assessment: Exceptions to [facility] Cervical AROM Overall Cervical AROM Comments: limited ROM to L side Thoracic Assessment Thoracic Assessment: Within Functional Limits Lumbar Assessment Lumbar Assessment: Within Functional Limits Postural Control Postural Control: Within Functional Limits  Balance Static Sitting Balance Static Sitting - Balance Support: No upper extremity supported;Feet unsupported Static Sitting - Level of Assistance: 7:  Independent Dynamic Sitting Balance Dynamic Sitting - Balance Support: No upper extremity supported;Left upper extremity supported;Right upper extremity supported;Feet unsupported Dynamic Sitting - Level of Assistance: 7: Independent Dynamic Sitting - Balance Activities: Lateral lean/weight shifting;Forward lean/weight shifting;Reaching for objects;Reaching across midline;Reaching for weighted objects Static Standing Balance Static Standing - Balance Support: Bilateral upper extremity supported Static Standing - Level of Assistance: 6: Modified independent (Device/Increase time) Dynamic Standing Balance Dynamic Standing - Balance Support: Bilateral upper extremity supported;Right upper extremity supported;Left upper extremity supported Dynamic Standing - Level of Assistance: 6: Modified independent (Device/Increase time) Dynamic Standing - Balance Activities: Lateral lean/weight shifting;Forward lean/weight shifting;Reaching for objects;Reaching for weighted objects;Reaching across midline Extremity Assessment  RUE Assessment RUE Assessment: Within Functional Limits (AROM WFL; strength not  tested) LUE Assessment LUE Assessment: Within Functional Limits RLE Assessment RLE Assessment: Within Functional Limits RLE Strength RLE Overall Strength Comments: Grossly 4+/5 LLE Assessment LLE Assessment: Exceptions to [facility] LLE Strength LLE Overall Strength Comments: Demonstrates good functional strength, fatigues easily  See FIM for current functional status  Gilmore Laroche 06/08/2014, 10:41 PM

## 2014-06-08 NOTE — Progress Notes (Signed)
[place] PHYSICAL MEDICINE & REHABILITATION     PROGRESS NOTE    Subjective/Complaints: No issues. Aware of D/C A  review of systems has been performed and if not noted above is otherwise negative.   Objective: Vital Signs: Blood pressure 128/78, pulse 88, temperature 98.1 F (36.7 C), temperature source Oral, resp. rate 18, height 5\' 9"  (1.753 m), weight 83.598 kg (184 lb 4.8 oz), SpO2 99.00%. No results found. No results found for this basename: WBC, HGB, HCT, PLT,  in the last 72 hours  Recent Labs  06/08/14 0520  CREATININE 0.82   CBG (last 3)  No results found for this basename: GLUCAP,  in the last 72 hours  Wt Readings from Last 3 Encounters:  06/01/14 83.598 kg (184 lb 4.8 oz)  05/25/14 81.647 kg (180 lb)  05/25/14 81.647 kg (180 lb)    Physical Exam:  General Comments: Pt able to recall precautions this session however continues to to need constant reminders. Pt continues to attempt to use right UE with all mobilty after constant cues of nonweightbearing  Physical Exam:  Blood pressure 133/83, pulse 99, temperature 98.6 F (37 C), temperature source Oral, resp. rate 18, height 5\' 9"  (1.753 m), weight 81.647 kg (180 lb), SpO2 100.00%.  Constitutional: He appears well-developed and well-nourished. He appears alert  Eyes: Pupils reactive to light  Right lid swelling better.   Cardiovascular: Regular rhythm. Tachycardia present.  Lungs /pulmonary .clear to auscultation  Abdomen .soft positive bowel sounds nontender  Neurological: alert. Moves all 4's but right shoulder , legs remained limited proximally due to pain. No gross sensory loss. CN notable for altered vision OD. Fair insight and awareness.  Skin:  Multiple facial abrasions and limb abrasions/areas of eschar    Assessment/Plan: 1. 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 Physiatrist is  providing close team supervision and 24 hour management of active medical problems listed below. Physiatrist and rehab team continue to assess barriers to discharge/monitor patient progress toward functional and medical goals.  Plan D/C in am  FIM: FIM - Bathing Bathing Steps Patient Completed: Chest;Right Arm;Left Arm;Abdomen;Front perineal area;Buttocks;Left lower leg (including foot);Right lower leg (including foot);Left upper leg;Right upper leg Bathing: 5: Supervision: Safety issues/verbal cues  FIM - Upper Body Dressing/Undressing Upper body dressing/undressing steps patient completed: Thread/unthread left sleeve of pullover shirt/dress;Pull shirt over trunk;Put head through opening of pull over shirt/dress;Thread/unthread right sleeve of pullover shirt/dresss Upper body dressing/undressing: 7: Complete Independence: No helper FIM - Lower Body Dressing/Undressing Lower body dressing/undressing steps patient completed: Thread/unthread right underwear leg;Thread/unthread left underwear leg;Pull underwear up/down;Thread/unthread right pants leg;Thread/unthread left pants leg;Pull pants up/down;Fasten/unfasten pants;Don/Doff right sock;Don/Doff left sock;Don/Doff right shoe;Don/Doff left shoe Lower body dressing/undressing: 6: Assistive device (Comment)  FIM - Toileting Toileting steps completed by patient: Adjust clothing prior to toileting;Performs perineal hygiene;Adjust clothing after toileting Toileting Assistive Devices: Grab bar or rail for support Toileting: 6: Assistive device: No helper  FIM - Diplomatic Services operational officerToilet Transfers Toilet Transfers Assistive Devices: Elevated toilet seat Toilet Transfers: 6-Assistive device: No helper;6-To toilet/ BSC;6-From toilet/BSC  FIM - BankerBed/Chair Transfer Bed/Chair Transfer Assistive Devices: Environmental consultantWalker;Arm rests Bed/Chair Transfer: 5: Chair or W/C > Bed: Supervision (verbal cues/safety issues);5: Bed > Chair or W/C: Supervision (verbal cues/safety issues);5: Sit  > Supine: Supervision (verbal cues/safety issues);5: Supine > Sit: Supervision (verbal cues/safety issues)  FIM - Locomotion: Wheelchair Distance: 150 Locomotion: Wheelchair: 5: Travels 150 ft or more: maneuvers on  rugs and over door sills with supervision, cueing or coaxing FIM - Locomotion: Ambulation Locomotion: Ambulation Assistive Devices: Walker - Rolling Ambulation/Gait Assistance: 5: Supervision Locomotion: Ambulation: 1: Travels less than 50 ft with supervision/safety issues  Comprehension Comprehension Mode: Auditory Comprehension: 6-Follows complex conversation/direction: With extra time/assistive device  Expression Expression Mode: Verbal Expression: 6-Expresses complex ideas: With extra time/assistive device  Social Interaction Social Interaction: 6-Interacts appropriately with others with medication or extra time (anti-anxiety, antidepressant).  Problem Solving Problem Solving: 5-Solves basic 90% of the time/requires cueing < 10% of the time  Memory Memory: 5-Recognizes or recalls 90% of the time/requires cueing < 10% of the time Medical Problem List and Plan:  1. 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 DVT Prophylaxis/Anticoagulation: Subcutaneous Lovenox.  vascular studies pending 3. Pain Management: Oxycodone and Robaxin as needed. Monitor with increased mobility   -?left supra-patellar pain?---observe for now. Consider xr 4. Mood/agitation. Improved. Bed alarm--observe  5. Neuropsych: This patient is capable of making decisions on his own behalf.  6. Acute blood loss anemia. Followup CBC today 7. Left acetabular fracture. Status post ORIF. Touchdown weightbearing/posterior hip precautions  8. Right clavicle fracture.  Weightbearing as tolerated range of motion as tolerated with sling for comfort  9. I&D of right calf wound and I&D with closure left arm wound 05/26/2014. Remove sutures 10. Open globe injury right eye. Repair of eye injuries  by Dr. Clarisa KindredGeiger 05/25/2014    LOS (Days) 7 A FACE TO FACE EVALUATION WAS PERFORMED  Claudette LawsKIRSTEINS,ANDREW E 06/08/2014 9:15 AM

## 2014-06-08 NOTE — Discharge Instructions (Signed)
Inpatient Rehab Discharge Instructions  Phillip BargeBrian D [name] Discharge date and time: No discharge date for patient encounter.   Activities/Precautions/ Functional Status: Activity: Touchdown weightbearing left lower extremity with posterior hip precautions weightbearing as tolerated right upper extremity with shoulder sling for comfort Diet: regular diet Wound Care: none needed Functional status:  ___ No restrictions     ___ Walk up steps independently ___ 24/7 supervision/assistance   ___ Walk up steps with assistance ___ Intermittent supervision/assistance  ___ Bathe/dress independently ___ Walk with walker     ___ Bathe/dress with assistance ___ Walk Independently    ___ Shower independently __x_ Walk with assistance    ___ Shower with assistance ___ No alcohol     ___ Return to work/school ________    COMMUNITY REFERRALS UPON DISCHARGE:    Outpatient: PT    OT                  Agency: [facility]      Phone: [phone]               Appointment Date/Time: 7/6 @ 10:15 am (arrive @ 9:45)                                                                 7/7 @ 8:30 am  Medical Equipment/Items Ordered: rolling walker                                                     Agency/Supplier: [facility] @ [phone]   GENERAL COMMUNITY RESOURCES FOR PATIENT/FAMILY:  Support Groups: Brain Injury Support Group       Special Instructions:    My questions have been answered and I understand these instructions. I will adhere to these goals and the provided educational materials after my discharge from the hospital.  Patient/Caregiver Signature _______________________________ Date __________  Clinician Signature _______________________________________ Date __________  Please bring this form and your medication list with you to all your follow-up doctor's appointments.

## 2014-06-08 NOTE — Progress Notes (Signed)
Occupational Therapy Discharge Summary  Patient Details  Name: Phillip [name] MRN: [id number] Date of Birth: 10-26-1981  Today's Date: 06/08/2014  Patient has met 63 of 16 long term goals due to improved activity tolerance, improved balance, postural control, ability to compensate for deficits, improved attention, improved awareness and improved coordination. Pt made steady progress with BADLs during this admission.  Pt is supervision for simple kitchen task at RW level.  Pt exhibits intellectual and emergent awareness of deficits and exhibits sustained attention when performing functional tasks. Pt's fiancee has been present to observe therapy. Pt exhibits behaviors consistent with [place] Level VIII. Patient to discharge at overall supervision>mod I level.  Patient's care partner is independent to provide the necessary supervision prn assistance at discharge.    Recommendation:  Patient will benefit from ongoing skilled OT services in outpatient setting to continue to advance functional skills in the area of BADL, iADL and Reduce care partner burden.  Equipment: No equipment provided. Pt owns tub seat  Reasons for discharge: treatment goals met and discharge from hospital  Patient/family agrees with progress made and goals achieved: Yes  OT Discharge Vision/Perception  Vision- History Baseline Vision/History: No visual deficits Patient Visual Report: Other (comment) (occasional blurring) Vision- Assessment Vision Assessment?: No apparent visual deficits  Cognition Overall Cognitive Status: Impaired/Different from baseline Arousal/Alertness: Awake/alert Orientation Level: Oriented X4 Attention: Selective Focused Attention: Appears intact Sustained Attention: Appears intact Selective Attention: Impaired Selective Attention Impairment: Verbal basic;Functional basic Memory: Impaired Memory Impairment: Decreased short term memory;Decreased recall of new information Awareness:  Impaired Awareness Impairment: Emergent impairment;Anticipatory impairment Problem Solving: Impaired Problem Solving Impairment: Functional basic Behaviors: Impulsive Rancho [place] Amigos Scales of Cognitive Functioning: Purposeful/appropriate Sensation Sensation Light Touch: Appears Intact Stereognosis: Appears Intact Hot/Cold: Appears Intact Proprioception: Appears Intact Coordination Gross Motor Movements are Fluid and Coordinated: Yes Fine Motor Movements are Fluid and Coordinated: Yes    Trunk/Postural Assessment  Cervical Assessment Cervical Assessment: Within Functional Limits Thoracic Assessment Thoracic Assessment: Within Functional Limits Lumbar Assessment Lumbar Assessment: Within Functional Limits Postural Control Postural Control: Within Functional Limits  Balance Static Sitting Balance Static Sitting - Balance Support: Left upper extremity supported;Feet supported Static Sitting - Level of Assistance: 7: Independent Dynamic Sitting Balance Dynamic Sitting - Balance Support: Right upper extremity supported;Feet supported;During functional activity Dynamic Sitting - Level of Assistance: 6: Modified independent (Device/Increase time) Extremity/Trunk Assessment RUE Assessment RUE Assessment: Within Functional Limits (AROM WFL; strength not tested) LUE Assessment LUE Assessment: Within Functional Limits  See FIM for current functional status  Leroy Libman 06/08/2014, 3:03 PM

## 2014-06-08 NOTE — Progress Notes (Signed)
Occupational Therapy Session Note  Patient Details  Name: Phillip BargeBrian D [name] MRN: [id number] Date of Birth: 11/26/1981  Today's Date: 06/08/2014  Session 1 Time: 0700-0755 Time Calculation (min): 55 min  Short Term Goals: Week 1:  OT Short Term Goal 1 (Week 1): Pt will sustain attention to self-care task ~15 minutes with min cues  OT Short Term Goal 2 (Week 1): Pt will identify 2 current deficits with min cues OT Short Term Goal 3 (Week 1): Pt will demonstrate recall of 2 events from previous therapy  OT Short Term Goal 4 (Week 1): Pt will complete toilet transfer at supervision level   Skilled Therapeutic Interventions/Progress Updates:    Pt resting in bed upon arrival and agreeable to bathing at shower level and dressing with sit<>stand from EOB.  Pt amb with RW to gather clothing prior to entering bathroom to use toilet before shower.  Pt returned to EOB after shower and completed dressing tasks.  Pt used AE appropriately for bathing and dressing tasks.  Pt completed bathing and dressing tasks at supervision level. Pt stood at sink to brush teeth.  Pt is able to maintain WBing preacutions and hip precautions without verbal cues this morning.  Pt mod I for toilet transfers and toileting.  Focus on activity tolerance, functional amb with RW, transfers, safety awareness, sit<>stand, and dynamic standing balance.    Therapy Documentation Precautions:  Precautions Precautions: Fall;Shoulder;Posterior Hip;Other (comment) Type of Shoulder Precautions: R Clavicle fx- weight bearing as tolerated; L LE TDWB Shoulder Interventions: For comfort;Shoulder sling/immobilizer Precaution Booklet Issued: No Precaution Comments: Posterior Hip Prec Handout provided Restrictions Weight Bearing Restrictions: Yes RUE Weight Bearing: Weight bearing as tolerated LLE Weight Bearing: Touchdown weight bearing Pain: Pain Assessment Pain Assessment: No/denies pain  See FIM for current functional  status  Therapy/Group: Individual Therapy  Session 2 Time: 1300-1400 Pt denied pain Individual Therapy  Pt amb with RW from room to tub room to practice tub transfers with tub seat.  Pt required rest break X 1.  Pt practiced transfer X 2 at supervision level.  Pt transitioned to therapy gym to engaged in dynamic standing activities on compliant surface (balance pad and balance board on foam).  Pt was able to adhere to precautions while maintaining balance with RUE as support.  Focus on transfers, dynamic standing balance, safety awareness, activity tolerance, and continued discharge planning.  Phillip [name], [facility] HospitalChappell 06/08/2014, 8:00 AM

## 2014-06-08 NOTE — Discharge Summary (Signed)
Discharge summary job [id number]

## 2014-06-09 DIAGNOSIS — S42009A Fracture of unspecified part of unspecified clavicle, initial encounter for closed fracture: Secondary | ICD-10-CM

## 2014-06-09 DIAGNOSIS — S069XAA Unspecified intracranial injury with loss of consciousness status unknown, initial encounter: Secondary | ICD-10-CM

## 2014-06-09 DIAGNOSIS — S32409A Unspecified fracture of unspecified acetabulum, initial encounter for closed fracture: Secondary | ICD-10-CM

## 2014-06-09 DIAGNOSIS — S069X9A Unspecified intracranial injury with loss of consciousness of unspecified duration, initial encounter: Secondary | ICD-10-CM

## 2014-06-09 MED ORDER — METOPROLOL TARTRATE 25 MG PO TABS: 25.0000 mg | ORAL_TABLET | Freq: Two times a day (BID) | ORAL | Status: AC

## 2014-06-09 MED ORDER — METHOCARBAMOL 500 MG PO TABS: 500.0000 mg | ORAL_TABLET | Freq: Four times a day (QID) | ORAL | Status: AC | PRN

## 2014-06-09 MED ORDER — ASPIRIN 325 MG PO TABS: 325.0000 mg | ORAL_TABLET | Freq: Two times a day (BID) | ORAL | Status: AC

## 2014-06-09 MED ORDER — ASPIRIN 325 MG PO TABS
325.0000 mg | ORAL_TABLET | Freq: Two times a day (BID) | ORAL | Status: DC
  Administered 2014-06-09: 325 mg via ORAL
  Filled 2014-06-09 (×4): qty 1

## 2014-06-09 MED ORDER — OXYCODONE HCL 5 MG PO TABS: 5.0000 mg | ORAL_TABLET | ORAL | Status: AC | PRN

## 2014-06-09 MED ORDER — PREDNISOLONE ACETATE 1 % OP SUSP: 1.0000 [drp] | Freq: Four times a day (QID) | OPHTHALMIC | Status: AC

## 2014-06-09 MED ORDER — GATIFLOXACIN 0.5 % OP SOLN: 1.0000 [drp] | Freq: Four times a day (QID) | OPHTHALMIC | Status: AC

## 2014-06-09 NOTE — Progress Notes (Signed)
Patient and girlfriend given discharge instructions and prescriptions by Deatra Inaan Angiulli, PA; all questions answered.  Patient escorted to girlfriend's vehicle by Hiltonsallie, NT.

## 2014-06-09 NOTE — Progress Notes (Signed)
[place] PHYSICAL MEDICINE & REHABILITATION     PROGRESS NOTE    Subjective/Complaints: Mod I in rm yesterday pm, no issues A  review of systems has been performed and if not noted above is otherwise negative.   Objective: Vital Signs: Blood pressure 125/78, pulse 71, temperature 98.1 F (36.7 C), temperature source Oral, resp. rate 18, height 5\' 9"  (1.753 m), weight 83.598 kg (184 lb 4.8 oz), SpO2 100.00%. No results found.  Recent Labs  06/08/14 1016  WBC 10.7*  HGB 10.0*  HCT 30.8*  PLT 433*    Recent Labs  06/08/14 0520  CREATININE 0.82   CBG (last 3)  No results found for this basename: GLUCAP,  in the last 72 hours  Wt Readings from Last 3 Encounters:  06/01/14 83.598 kg (184 lb 4.8 oz)  05/25/14 81.647 kg (180 lb)  05/25/14 81.647 kg (180 lb)    Physical Exam:  General Comments: Pt able to recall precautions this session however continues to to need constant reminders. Pt continues to attempt to use right UE with all mobilty after constant cues of nonweightbearing  Physical Exam:  AVSS Constitutional: He appears well-developed and well-nourished. He appears alert   Cardiovascular: Regular rhythm. Tachycardia present.  Lungs /pulmonary .clear to auscultation  Abdomen .soft positive bowel sounds nontender  Neurological: alert.   Assessment/Plan: 1. 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 Stable for D/C today F/u PCP in 1-2 weeks F/u PM&R 3 weeks See D/C summary See D/C instructions  FIM: FIM - Bathing Bathing Steps Patient Completed: Chest;Right Arm;Left Arm;Abdomen;Front perineal area;Buttocks;Left lower leg (including foot);Right lower leg (including foot);Left upper leg;Right upper leg Bathing: 5: Supervision: Safety issues/verbal cues  FIM - Upper Body Dressing/Undressing Upper body dressing/undressing steps patient completed:  Thread/unthread left sleeve of pullover shirt/dress;Pull shirt over trunk;Put head through opening of pull over shirt/dress;Thread/unthread right sleeve of pullover shirt/dresss Upper body dressing/undressing: 7: Complete Independence: No helper FIM - Lower Body Dressing/Undressing Lower body dressing/undressing steps patient completed: Thread/unthread right underwear leg;Thread/unthread left underwear leg;Pull underwear up/down;Thread/unthread right pants leg;Thread/unthread left pants leg;Pull pants up/down;Fasten/unfasten pants;Don/Doff right sock;Don/Doff left sock;Don/Doff right shoe;Don/Doff left shoe Lower body dressing/undressing: 6: Assistive device (Comment)  FIM - Toileting Toileting steps completed by patient: Adjust clothing prior to toileting;Performs perineal hygiene;Adjust clothing after toileting Toileting Assistive Devices: Grab bar or rail for support Toileting: 6: Assistive device: No helper  FIM - Diplomatic Services operational officerToilet Transfers Toilet Transfers Assistive Devices: Elevated toilet seat Toilet Transfers: 6-Assistive device: No helper;6-To toilet/ BSC;6-From toilet/BSC  FIM - BankerBed/Chair Transfer Bed/Chair Transfer Assistive Devices: Therapist, occupationalWalker Bed/Chair Transfer: 6: Supine > Sit: No assist;6: Sit > Supine: No assist;6: Bed > Chair or W/C: No assist;6: Chair or W/C > Bed: No assist;1: Supine > Sit: Total A (helper does all/Pt. < 25%)  FIM - Locomotion: Wheelchair Distance: 200' Locomotion: Wheelchair: 5: Travels 150 ft or more: maneuvers on rugs and over door sills with supervision, cueing or coaxing FIM - Locomotion: Ambulation Locomotion: Ambulation Assistive Devices: Designer, industrial/productWalker - Rolling Ambulation/Gait Assistance: 6: Modified independent (Device/Increase time) Locomotion: Ambulation: 6: Travels 150 ft or more with assistive device/no helper  Comprehension Comprehension Mode: Auditory Comprehension: 6-Follows complex conversation/direction: With extra time/assistive  device  Expression Expression Mode: Verbal Expression: 6-Expresses complex ideas: With extra time/assistive device  Social Interaction Social Interaction: 6-Interacts appropriately with others with medication or extra time (anti-anxiety, antidepressant).  Problem Solving Problem Solving: 5-Solves complex 90%  of the time/cues < 10% of the time  Memory Memory: 5-Requires cues to use assistive device Medical Problem List and Plan:  1. 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 DVT Prophylaxis/Anticoagulation: Subcutaneous Lovenox.  vascular studies pending 3. Pain Management: Oxycodone and Robaxin as needed. Monitor with increased mobility   -?left supra-patellar pain?---observe for now. Consider xr 4. Mood/agitation. Improved. Bed alarm--observe  5. Neuropsych: This patient is capable of making decisions on his own behalf.  6. Acute blood loss anemia. Followup CBC today 7. Left acetabular fracture. Status post ORIF. Touchdown weightbearing/posterior hip precautions  8. Right clavicle fracture.  Weightbearing as tolerated range of motion as tolerated with sling for comfort  9. I&D of right calf wound and I&D with closure left arm wound 05/26/2014. Remove sutures 10. Open globe injury right eye. Repair of eye injuries by Dr. Clarisa KindredGeiger 05/25/2014    LOS (Days) 8 A FACE TO FACE EVALUATION WAS PERFORMED  Claudette LawsKIRSTEINS,ANDREW E 06/09/2014 7:26 AM

## 2014-06-09 NOTE — Progress Notes (Signed)
Social Work  Discharge Note  The overall goal for the admission was met for:   Discharge location: Yes - home with girlfriend who can provide 24/7 supervision  Length of Stay: Yes - 8 days  Discharge activity level: Yes - supervision overall  Home/community participation: Yes  Services provided included: MD, RD, PT, OT, SLP, RN, TR, Pharmacy and SW  Financial Services: Other: NONE  Follow-up services arranged: Outpatient: PT, OT via [facility], DME: rolling walker via [place], Other: [facility] medication assist program and Patient/Family has no preference for HH/DME agencies  Comments (or additional information):  Patient/Family verbalized understanding of follow-up arrangements: Yes  Individual responsible for coordination of the follow-up plan: patient  Confirmed correct DME delivered: Berdell Nevitt 06/09/2014    [place], [place]

## 2014-06-15 ENCOUNTER — Ambulatory Visit: Payer: Self-pay | Attending: Physical Medicine & Rehabilitation

## 2014-06-15 DIAGNOSIS — M2560 Stiffness of unspecified joint, not elsewhere classified: Secondary | ICD-10-CM | POA: Insufficient documentation

## 2014-06-15 DIAGNOSIS — Z5189 Encounter for other specified aftercare: Secondary | ICD-10-CM | POA: Insufficient documentation

## 2014-06-15 DIAGNOSIS — R279 Unspecified lack of coordination: Secondary | ICD-10-CM | POA: Insufficient documentation

## 2014-06-15 DIAGNOSIS — Z87820 Personal history of traumatic brain injury: Secondary | ICD-10-CM | POA: Insufficient documentation

## 2014-06-15 DIAGNOSIS — R262 Difficulty in walking, not elsewhere classified: Secondary | ICD-10-CM | POA: Insufficient documentation

## 2014-06-16 ENCOUNTER — Ambulatory Visit: Payer: Self-pay | Admitting: Occupational Therapy

## 2014-06-16 DIAGNOSIS — S32402S Unspecified fracture of left acetabulum, sequela: Secondary | ICD-10-CM

## 2014-06-16 DIAGNOSIS — T07XXXA Unspecified multiple injuries, initial encounter: Secondary | ICD-10-CM

## 2014-06-18 ENCOUNTER — Encounter: Payer: Self-pay | Admitting: Occupational Therapy

## 2014-06-24 ENCOUNTER — Ambulatory Visit: Payer: Self-pay | Admitting: Occupational Therapy

## 2014-06-24 ENCOUNTER — Ambulatory Visit: Payer: Self-pay

## 2014-06-30 ENCOUNTER — Ambulatory Visit: Payer: Self-pay | Admitting: Occupational Therapy

## 2014-06-30 ENCOUNTER — Ambulatory Visit: Payer: Self-pay

## 2014-07-06 ENCOUNTER — Ambulatory Visit: Payer: Self-pay

## 2014-07-06 ENCOUNTER — Ambulatory Visit: Payer: Self-pay | Admitting: Occupational Therapy

## 2014-07-13 ENCOUNTER — Ambulatory Visit: Payer: Self-pay | Attending: Physical Medicine & Rehabilitation | Admitting: Occupational Therapy

## 2014-07-13 DIAGNOSIS — R262 Difficulty in walking, not elsewhere classified: Secondary | ICD-10-CM | POA: Insufficient documentation

## 2014-07-13 DIAGNOSIS — Z5189 Encounter for other specified aftercare: Secondary | ICD-10-CM | POA: Insufficient documentation

## 2014-07-13 DIAGNOSIS — R279 Unspecified lack of coordination: Secondary | ICD-10-CM | POA: Insufficient documentation

## 2014-07-13 DIAGNOSIS — Z87820 Personal history of traumatic brain injury: Secondary | ICD-10-CM | POA: Insufficient documentation

## 2014-07-13 DIAGNOSIS — M2560 Stiffness of unspecified joint, not elsewhere classified: Secondary | ICD-10-CM | POA: Insufficient documentation

## 2014-07-14 ENCOUNTER — Ambulatory Visit: Payer: Self-pay | Admitting: Occupational Therapy

## 2014-07-15 ENCOUNTER — Ambulatory Visit: Payer: Self-pay

## 2014-07-20 ENCOUNTER — Encounter: Payer: Self-pay | Admitting: Occupational Therapy

## 2014-07-20 ENCOUNTER — Ambulatory Visit: Payer: Self-pay

## 2014-07-21 ENCOUNTER — Encounter: Payer: Self-pay | Admitting: Occupational Therapy

## 2014-07-23 ENCOUNTER — Ambulatory Visit: Payer: Self-pay

## 2014-07-24 ENCOUNTER — Ambulatory Visit: Payer: Self-pay | Admitting: *Deleted

## 2014-07-27 ENCOUNTER — Ambulatory Visit: Payer: Self-pay | Admitting: Physical Therapy

## 2014-07-27 ENCOUNTER — Ambulatory Visit: Payer: Self-pay | Admitting: Occupational Therapy

## 2014-07-31 ENCOUNTER — Ambulatory Visit: Payer: Self-pay | Admitting: Physical Therapy

## 2014-08-03 ENCOUNTER — Ambulatory Visit: Payer: Self-pay

## 2014-08-03 ENCOUNTER — Ambulatory Visit: Payer: Self-pay | Admitting: Occupational Therapy

## 2014-08-06 ENCOUNTER — Ambulatory Visit: Payer: Self-pay

## 2014-08-10 ENCOUNTER — Ambulatory Visit: Payer: Self-pay

## 2014-08-12 ENCOUNTER — Encounter: Payer: Self-pay | Attending: Physical Medicine & Rehabilitation | Admitting: Physical Medicine & Rehabilitation

## 2014-08-13 ENCOUNTER — Ambulatory Visit: Payer: Self-pay

## 2014-08-21 ENCOUNTER — Ambulatory Visit: Payer: Self-pay | Attending: Physical Medicine & Rehabilitation

## 2014-08-21 DIAGNOSIS — R262 Difficulty in walking, not elsewhere classified: Secondary | ICD-10-CM | POA: Insufficient documentation

## 2014-08-21 DIAGNOSIS — Z5189 Encounter for other specified aftercare: Secondary | ICD-10-CM | POA: Insufficient documentation

## 2014-08-21 DIAGNOSIS — M2560 Stiffness of unspecified joint, not elsewhere classified: Secondary | ICD-10-CM | POA: Insufficient documentation

## 2014-08-21 DIAGNOSIS — R279 Unspecified lack of coordination: Secondary | ICD-10-CM | POA: Insufficient documentation

## 2014-08-21 DIAGNOSIS — Z87820 Personal history of traumatic brain injury: Secondary | ICD-10-CM | POA: Insufficient documentation

## 2016-04-30 ENCOUNTER — Emergency Department (HOSPITAL_COMMUNITY): Payer: MEDICAID

## 2016-04-30 ENCOUNTER — Emergency Department (HOSPITAL_COMMUNITY)
Admission: EM | Admit: 2016-04-30 | Discharge: 2016-04-30 | Disposition: A | Discharge status: Home / Self Care | Payer: MEDICAID | Attending: Emergency Medicine | Admitting: Emergency Medicine

## 2016-04-30 ENCOUNTER — Encounter (HOSPITAL_COMMUNITY): Payer: Self-pay | Admitting: Family Medicine

## 2016-04-30 DIAGNOSIS — M25552 Pain in left hip: Secondary | ICD-10-CM

## 2016-04-30 DIAGNOSIS — S79912A Unspecified injury of left hip, initial encounter: Secondary | ICD-10-CM | POA: Insufficient documentation

## 2016-04-30 DIAGNOSIS — Y998 Other external cause status: Secondary | ICD-10-CM | POA: Insufficient documentation

## 2016-04-30 DIAGNOSIS — Y9289 Other specified places as the place of occurrence of the external cause: Secondary | ICD-10-CM | POA: Insufficient documentation

## 2016-04-30 DIAGNOSIS — Z79899 Other long term (current) drug therapy: Secondary | ICD-10-CM | POA: Insufficient documentation

## 2016-04-30 DIAGNOSIS — Z792 Long term (current) use of antibiotics: Secondary | ICD-10-CM | POA: Insufficient documentation

## 2016-04-30 DIAGNOSIS — Z9889 Other specified postprocedural states: Secondary | ICD-10-CM | POA: Insufficient documentation

## 2016-04-30 DIAGNOSIS — F17210 Nicotine dependence, cigarettes, uncomplicated: Secondary | ICD-10-CM | POA: Insufficient documentation

## 2016-04-30 DIAGNOSIS — Z7952 Long term (current) use of systemic steroids: Secondary | ICD-10-CM | POA: Insufficient documentation

## 2016-04-30 DIAGNOSIS — Z7982 Long term (current) use of aspirin: Secondary | ICD-10-CM | POA: Insufficient documentation

## 2016-04-30 DIAGNOSIS — Y9389 Activity, other specified: Secondary | ICD-10-CM | POA: Insufficient documentation

## 2016-04-30 DIAGNOSIS — X501XXA Overexertion from prolonged static or awkward postures, initial encounter: Secondary | ICD-10-CM | POA: Insufficient documentation

## 2016-04-30 NOTE — ED Provider Notes (Signed)
CSN: [id number]     Arrival date & time 04/30/16  1322 History   First MD Initiated Contact with Patient 04/30/16 1413     Chief Complaint  Patient presents with  . Hip Pain     (Consider location/radiation/quality/duration/timing/severity/associated sxs/prior Treatment) Patient is a 35 y.o. male presenting with hip pain. The history is provided by the patient and medical records.  Hip Pain Associated symptoms include arthralgias.   35 year old male here with left hip pain. He had ORIF 2 years ago by Dr. Carola FrostHandy after a trauma. He has hardware present in his left hip. He states last night he went to cross his legs and he felt a "pop" in his left hip.  He states he felt like his hip dislocated briefly.  States today his hip is feeling better but remains sore.  He denies numbness or weakness of his left leg. Denies any difficulty ambulating. He denies any recent fever, chills, or sweats. No other complications noted with his hip.  History reviewed. No pertinent past medical history. Past Surgical History  Procedure Laterality Date  . Ruptured globe exploration and repair Right 05/25/2014    Procedure: REPAIR OF RUPTURED GLOBE WITH PROLAPSED UVEAL TISSUE RIGHT EYE;  Surgeon: Shade FloodGreer Geiger, MD;  [facility];  Service: Ophthalmology;  Laterality: Right;  . Debridement and closure wound Bilateral 05/25/2014    Procedure: SUPERFICIAL  CLOSURE OF WOUNDS AND DRESSING  ON RIGHT NECK, RIGHT LOWER LEG, LEFT FOREARM;  Surgeon: Shade FloodGreer Geiger, MD;  [facility];  Service: Ophthalmology;  Laterality: Bilateral;  . Orif acetabular fracture Left 05/26/2014    Procedure: OPEN REDUCTION INTERNAL FIXATION (ORIF) ACETABULAR FRACTURE;  Surgeon: Budd PalmerMichael H Handy, MD;  Location: [facility] OR;  Service: Orthopedics;  Laterality: Left;  . Incision and drainage of wound Right 05/26/2014    Procedure: IRRIGATION AND DEBRIDEMENT WOUND WITH WOUND CLOSURE;  Surgeon: Budd PalmerMichael H Handy, MD;  Location: [facility] OR;  Service: Orthopedics;   Laterality: Right;   Family History  Problem Relation Age of Onset  . Other Father     substance abuse/suicide.   . Stroke Mother    Social History  Substance Use Topics  . Smoking status: Current Every Day Smoker -- 1.00 packs/day    Types: Cigarettes  . Smokeless tobacco: None  . Alcohol Use: Yes     Comment: "the other day"    Review of Systems  Musculoskeletal: Positive for arthralgias.  All other systems reviewed and are negative.     Allergies  Review of patient's allergies indicates no known allergies.  Home Medications   Prior to Admission medications   Medication Sig Start Date End Date Taking? Authorizing Provider  aspirin 325 MG tablet Take 1 tablet (325 mg total) by mouth 2 (two) times daily. 06/09/14   Mcarthur Rossettianiel J Angiulli, PA-C  fexofenadine (ALLEGRA) 180 MG tablet Take 180 mg by mouth daily as needed for allergies or rhinitis.    Historical Provider, MD  gatifloxacin (ZYMAXID) 0.5 % SOLN Place 1 drop into the right eye 4 (four) times daily. 06/09/14   Mcarthur Rossettianiel J Angiulli, PA-C  methocarbamol (ROBAXIN) 500 MG tablet Take 1 tablet (500 mg total) by mouth every 6 (six) hours as needed for muscle spasms. 06/09/14   Mcarthur Rossettianiel J Angiulli, PA-C  metoprolol tartrate (LOPRESSOR) 25 MG tablet Take 1 tablet (25 mg total) by mouth 2 (two) times daily. 06/09/14   Mcarthur Rossettianiel J Angiulli, PA-C  oxyCODONE (OXY IR/ROXICODONE) 5 MG immediate release tablet Take 1-3 tablets (5-15  mg total) by mouth every 4 (four) hours as needed for severe pain (  for mild pain,  for moderate pain,  for severe pain). 06/09/14   Mcarthur Rossetti Angiulli, PA-C  prednisoLONE acetate (PRED FORTE) 1 % ophthalmic suspension Place 1 drop into the right eye 4 (four) times daily. 06/09/14   Daniel J Angiulli, PA-C   BP 130/86 mmHg  Pulse 70  Temp(Src) 98.2 F (36.8 C) (Oral)  Resp 16  SpO2 99%   Physical Exam  Constitutional: He is oriented to person, place, and time. He appears well-developed and well-nourished.  No distress.  HENT:  Head: Normocephalic and atraumatic.  Mouth/Throat: Oropharynx is clear and moist.  Eyes: Conjunctivae and EOM are normal. Pupils are equal, round, and reactive to light.  Neck: Normal range of motion. Neck supple.  Cardiovascular: Normal rate, regular rhythm and normal heart sounds.   Pulmonary/Chest: Effort normal and breath sounds normal. No respiratory distress. He has no wheezes.  Musculoskeletal: Normal range of motion.  Left hip without overt tenderness, no malrotation or leg shortening, ambulatory with steady gait  Neurological: He is alert and oriented to person, place, and time.  Skin: Skin is warm and dry. He is not diaphoretic.  Psychiatric: He has a normal mood and affect.  Nursing note and vitals reviewed.   ED Course  Procedures (including critical care time) Labs Review Labs Reviewed - No data to display  Imaging Review Dg Hip Unilat With Pelvis 2-3 Views Left  04/30/2016  CLINICAL DATA:  35 year old with acute onset of left hip pain after crossing his legs earlier today. Prior ORIF of left acetabular fractures in June, 2015. No recent injuries. EXAM: DG HIP (WITH OR WITHOUT PELVIS) 2-3V LEFT COMPARISON:  Preoperative and intraoperative pelvis and left hip x-rays 05/26/2014. Postoperative pelvic x-rays 05/28/2014. FINDINGS: Prior ORIF of the left acetabular fracture with normal healing. No complicating features. Likely intra-articular loose body in the left hip projected inferior to the femoral head. No evidence of acute fracture or dislocation. Well-preserved joint space in the left hip. Included AP pelvis demonstrates a normal-appearing contralateral right hip joint. Sacroiliac joints and symphysis pubis intact. 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 IMPRESSION: 1. No acute osseous abnormality. 2. Intraarticular loose body inferiorly in the left hip joint. 3. Prior ORIF of an acetabular fracture with normal healing. 4.  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 Electronically Signed   By: Hulan Saas M.D.   On: 04/30/2016 15:17   I have personally reviewed and evaluated these images and lab results as part of my medical decision-making.   EKG Interpretation None      MDM   Final diagnoses:  Left hip pain   35 year old male here with left hip pain. He does have known hardware in place from ORIF 2 years ago. He has no deformities of the left hip or leg shortening noted on exam. He is ambulatory with steady gait. Screening x-ray obtained, no evidence of fracture or dislocation. He does have an intra-articular loose body which appears chronic.  Patient discharged home with supportive care. He will follow-up with his orthopedist, Dr. Carola Frost, for any ongoing issues.  Given disc of x-ray images from radiology.  Discussed plan with patient, he/she acknowledged understanding and agreed with plan of care.  Return precautions given for new or worsening symptoms.  Garlon Hatchet, PA-C 04/30/16 1612  Gerhard Munch, MD 05/01/16 [phone]

## 2016-04-30 NOTE — ED Notes (Signed)
Patient Alert and oriented X4. Stable and ambulatory. Patient verbalized understanding of the discharge instructions.  Patient belongings were taken by the patient.  

## 2016-04-30 NOTE — ED Notes (Signed)
Pt called twice for triage with no answer 

## 2016-04-30 NOTE — Discharge Instructions (Signed)
X-rays today look good. Please follow-up with Dr. Carola FrostHandy for any ongoing issues. Return here for new concerns.

## 2016-04-30 NOTE — ED Notes (Signed)
Pt here left hip pain after placing in awkward position. sts a few years ago he has surgery on that hip. sts hurts with walking movement.

## 2016-04-30 NOTE — ED Notes (Addendum)
Pt c/o left hip pain after "crossing legs" 2 days ago. Hx of traumatic injury with acetabular fx and left hip replacement in 2015.

## 2017-01-28 IMAGING — DX DG HIP (WITH OR WITHOUT PELVIS) 2-3V*L*
3 series · 3 of 3 positions shown · non-contrast
Comparison: Preoperative and intraoperative pelvis and left hip
x-rays 05/26/2014. Postoperative pelvic x-rays 05/28/2014.

CLINICAL DATA: 34-year-old with acute onset of left hip pain after
crossing his legs earlier today. Prior ORIF of left acetabular
fractures in May 2014. No recent injuries.

EXAM:
DG HIP (WITH OR WITHOUT PELVIS) 2-3V LEFT

[pelvis ap]
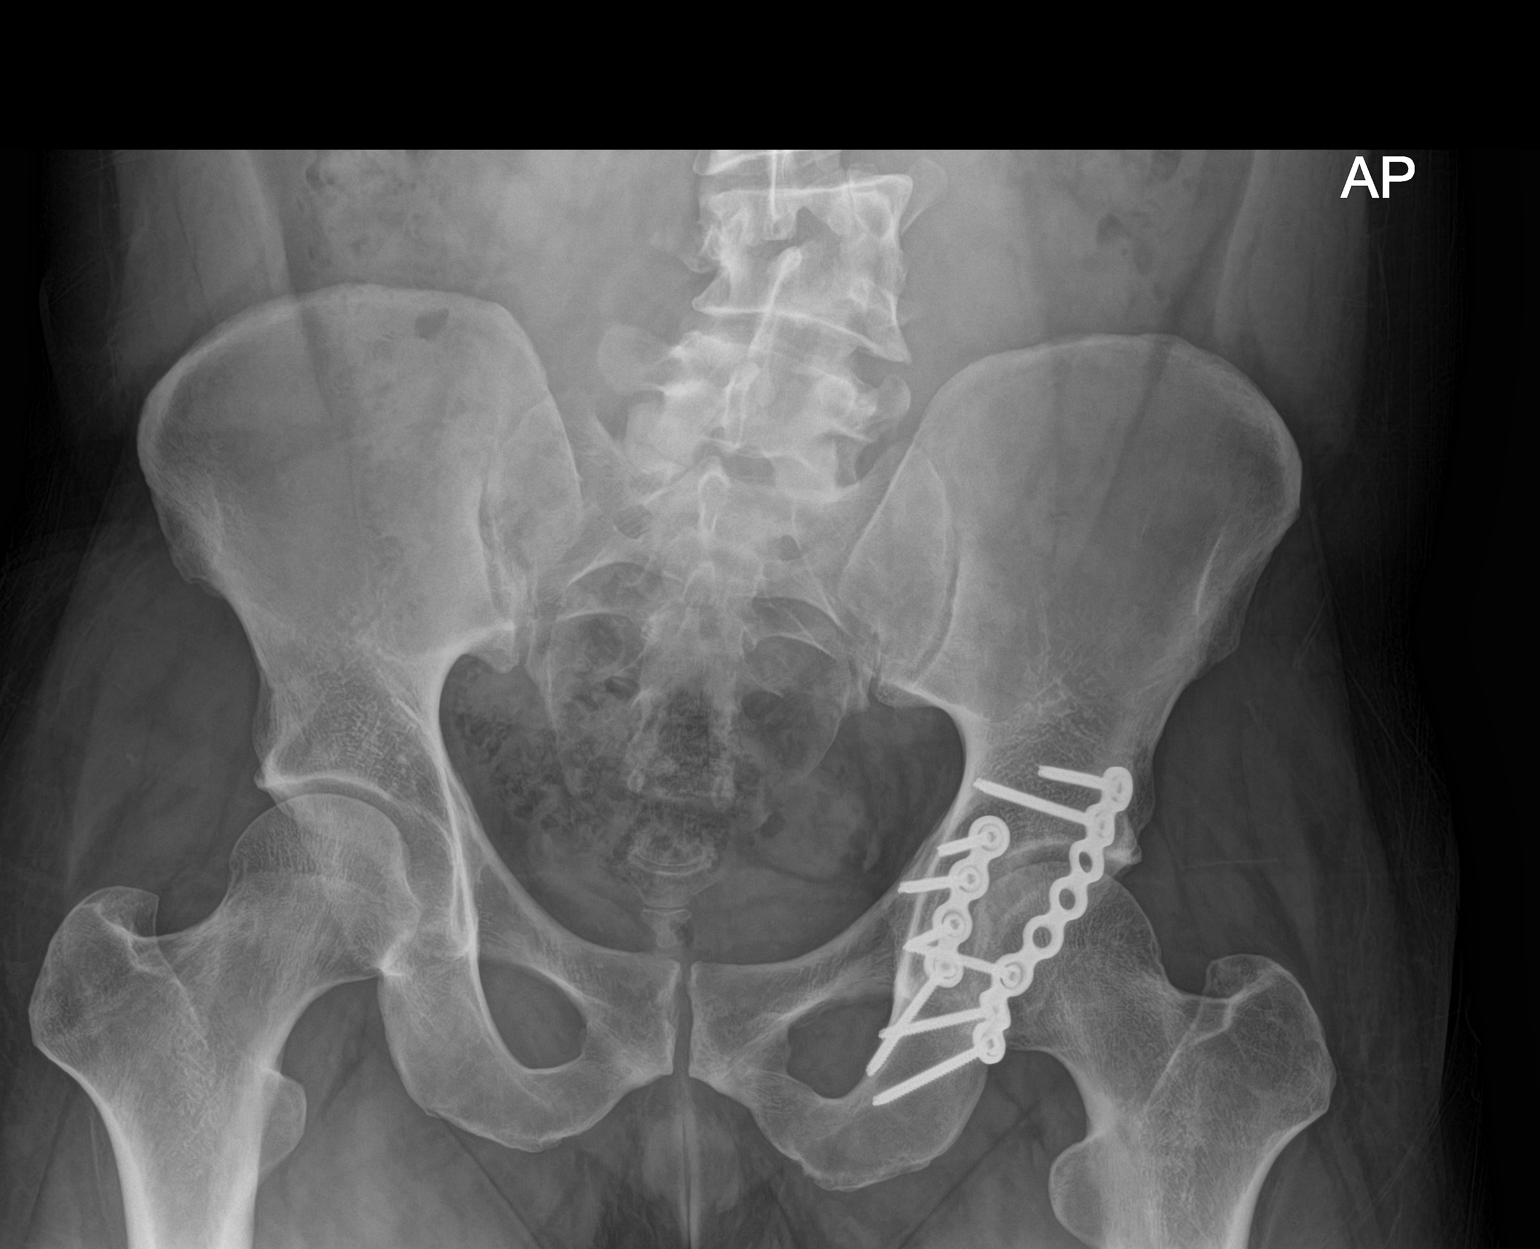

[hip ap]
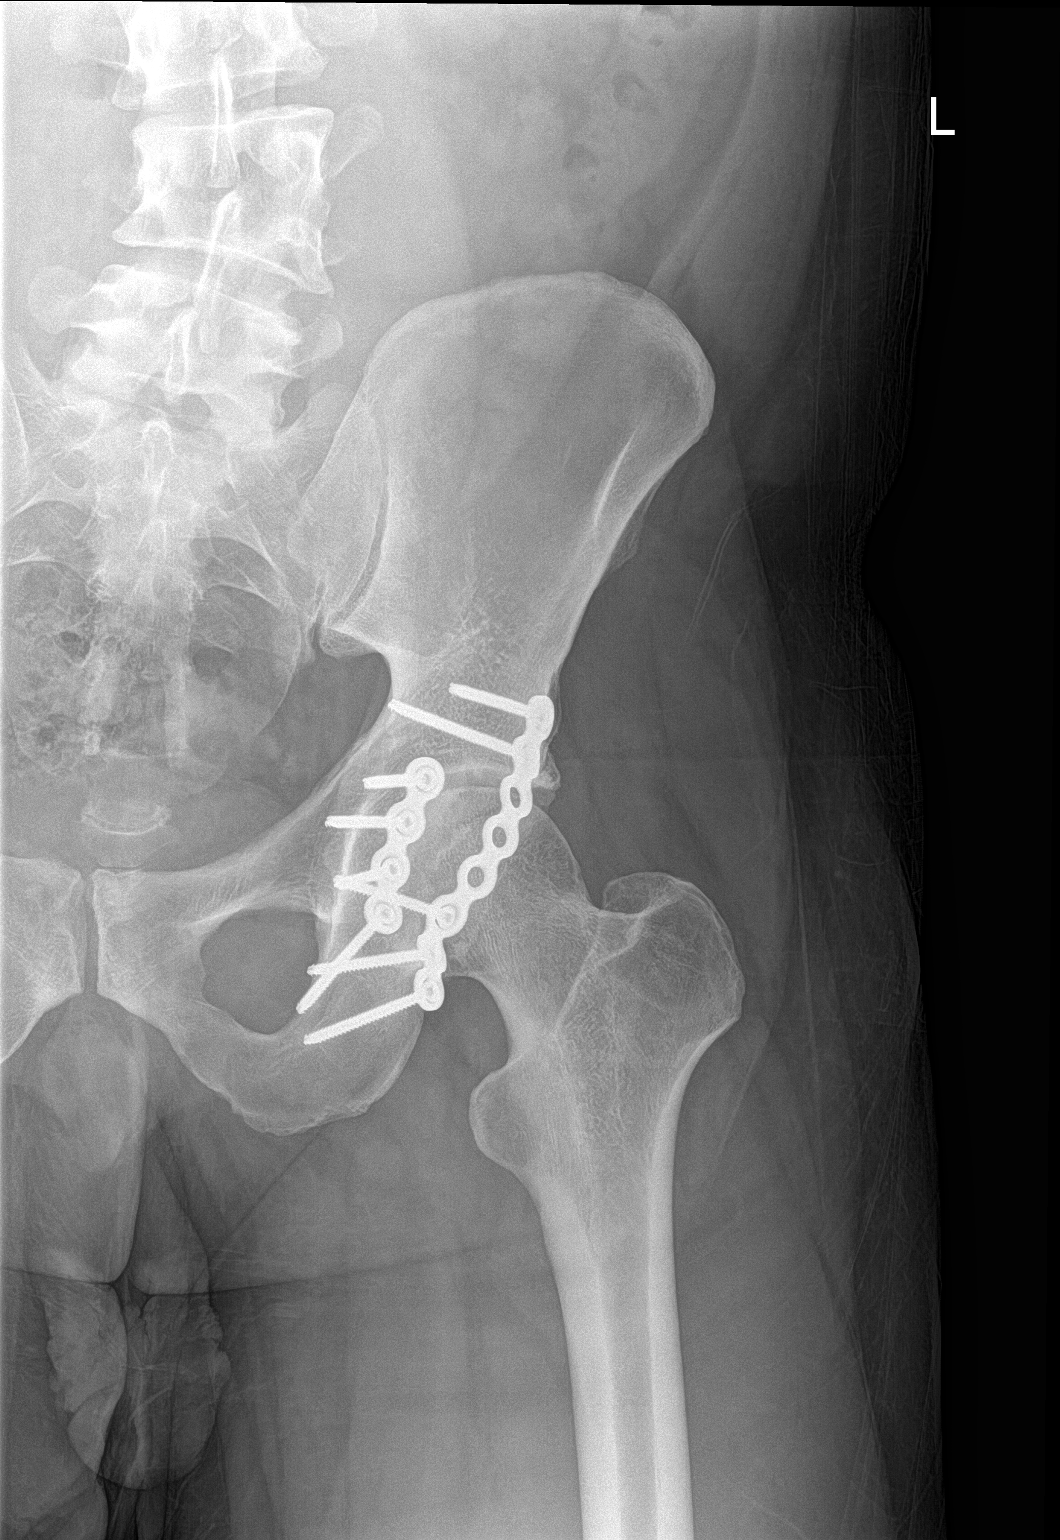

[hip lat]
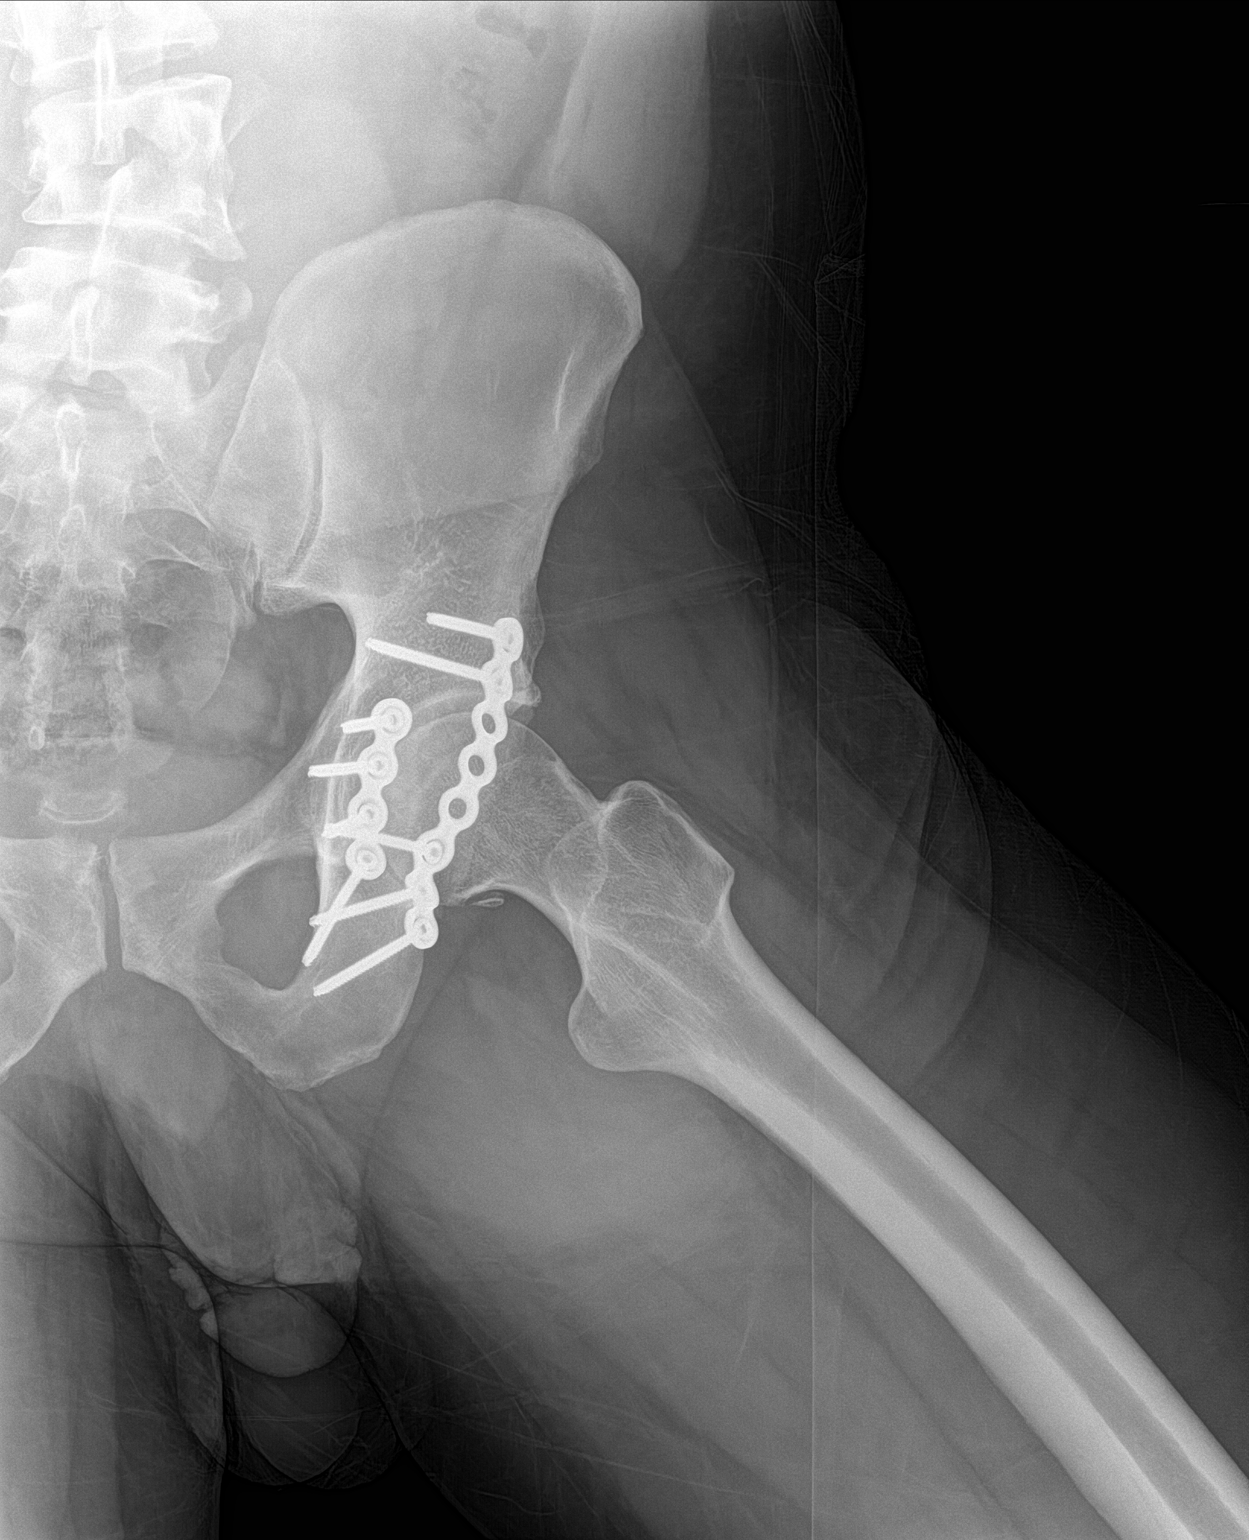

[3 of 3 positions shown; findings below may reference images not displayed]

FINDINGS: Prior ORIF of the left acetabular fracture with normal healing. No
complicating features. Likely intra-articular loose body in the left
hip projected inferior to the femoral head. No evidence of acute
fracture or dislocation. Well-preserved joint space in the left hip.

Included AP pelvis demonstrates a normal-appearing contralateral
right hip joint. Sacroiliac joints and symphysis pubis intact.
Lumbar levoscoliosis with congenital deformity of L4 as noted
previously.
IMPRESSION: 1. No acute osseous abnormality.
2. Intraarticular loose body inferiorly in the left hip joint.
3. Prior ORIF of an acetabular fracture with normal healing.
4. Lumbar levoscoliosis with congenital deformity of L4 as noted
previously.

## 2021-12-12 ENCOUNTER — Encounter (HOSPITAL_COMMUNITY): Payer: Self-pay

## 2021-12-12 ENCOUNTER — Emergency Department (HOSPITAL_COMMUNITY)
Admission: EM | Admit: 2021-12-12 | Discharge: 2021-12-13 | Disposition: A | Discharge status: Home / Self Care | Payer: Self-pay | Attending: Emergency Medicine | Admitting: Emergency Medicine

## 2021-12-12 ENCOUNTER — Other Ambulatory Visit: Payer: Self-pay

## 2021-12-12 DIAGNOSIS — F4320 Adjustment disorder, unspecified: Secondary | ICD-10-CM

## 2021-12-12 DIAGNOSIS — Y900 Blood alcohol level of less than 20 mg/100 ml: Secondary | ICD-10-CM | POA: Insufficient documentation

## 2021-12-12 DIAGNOSIS — Z046 Encounter for general psychiatric examination, requested by authority: Secondary | ICD-10-CM | POA: Insufficient documentation

## 2021-12-12 DIAGNOSIS — Z7982 Long term (current) use of aspirin: Secondary | ICD-10-CM | POA: Insufficient documentation

## 2021-12-12 DIAGNOSIS — R45851 Suicidal ideations: Secondary | ICD-10-CM

## 2021-12-12 DIAGNOSIS — S069XAA Unspecified intracranial injury with loss of consciousness status unknown, initial encounter: Secondary | ICD-10-CM | POA: Diagnosis present

## 2021-12-12 LAB — ETHANOL: Alcohol, Ethyl (B): <10 mg/dL (ref <10)

## 2021-12-12 LAB — CBC
HCT: 40.8 % (ref 39.0–52.0)
Hemoglobin: 13.8 g/dL (ref 13.0–17.0)
MCH: 31.8 pg (ref 26.0–34.0)
MCHC: 33.8 g/dL (ref 30.0–36.0)
MCV: 94 fL (ref 80.0–100.0)
Platelets: 221 10*3/uL (ref 150–400)
RBC: 4.34 MIL/uL (ref 4.22–5.81)
RDW: 13.4 % (ref 11.5–15.5)
WBC: 8.4 10*3/uL (ref 4.0–10.5)
nRBC: 0 % (ref 0.0–0.2)

## 2021-12-12 LAB — COMPREHENSIVE METABOLIC PANEL
ALT: 14 U/L (ref 0–44)
AST: 26 U/L (ref 15–41)
Albumin: 4 g/dL (ref 3.5–5.0)
Alkaline Phosphatase: 41 U/L (ref 38–126)
Anion gap: 7 (ref 5–15)
BUN: 12 mg/dL (ref 6–20)
CO2: 28 mmol/L (ref 22–32)
Calcium: 9 mg/dL (ref 8.9–10.3)
Chloride: 104 mmol/L (ref 98–111)
Creatinine, Ser: 0.91 mg/dL (ref 0.61–1.24)
GFR, Estimated: >60 mL/min (ref >60)
Glucose, Bld: 90 mg/dL (ref 70–99)
Potassium: 4.2 mmol/L (ref 3.5–5.1)
Sodium: 139 mmol/L (ref 135–145)
Total Bilirubin: 0.5 mg/dL (ref 0.3–1.2)
Total Protein: 6.9 g/dL (ref 6.5–8.1)

## 2021-12-12 LAB — RAPID URINE DRUG SCREEN, HOSP PERFORMED
Amphetamines: NOT DETECTED
Barbiturates: NOT DETECTED
Benzodiazepines: NOT DETECTED
Cocaine: NOT DETECTED
Opiates: NOT DETECTED
Tetrahydrocannabinol: POSITIVE — AB

## 2021-12-12 LAB — SALICYLATE LEVEL: Salicylate Lvl: <7 mg/dL — ABNORMAL LOW (ref 7.0–30.0)

## 2021-12-12 LAB — ACETAMINOPHEN LEVEL: Acetaminophen (Tylenol), Serum: <10 ug/mL — ABNORMAL LOW (ref 10–30)
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
## 2021-12-12 NOTE — ED Provider Notes (Signed)
[facility] EMERGENCY DEPARTMENT Provider Note   CSN: [id number] Arrival date & time: 12/12/21  1937     History  Chief Complaint  Patient presents with   Medical Clearance    Phillip [name] is a 41 y.o. male presenting under IVC for psychiatric evaluation.  Level 5 caveat due to psychiatric disorder.  Patient states he is here because his girlfriend wanted him out of the house so she could cheat on him.  He denies SI, HI, or AVH.  Per triage note, patient was brought in by [facility] department after girlfriend took out IVC paperwork.  Per triage note, patient was on speaker phone in the presence of law enforcement and made suicidal statements.  HPI     Home Medications Prior to Admission medications   Medication Sig Start Date End Date Taking? Authorizing Provider  aspirin 325 MG tablet Take 1 tablet (325 mg total) by mouth 2 (two) times daily. 06/09/14   Angiulli, Mcarthur Rossetti, PA-C  fexofenadine (ALLEGRA) 180 MG tablet Take 180 mg by mouth daily as needed for allergies or rhinitis.    [provider]  gatifloxacin (ZYMAXID) 0.5 % SOLN Place 1 drop into the right eye 4 (four) times daily. 06/09/14   Angiulli, Mcarthur Rossetti, PA-C  methocarbamol (ROBAXIN) 500 MG tablet Take 1 tablet (500 mg total) by mouth every 6 (six) hours as needed for muscle spasms. 06/09/14   Angiulli, Mcarthur Rossetti, PA-C  metoprolol tartrate (LOPRESSOR) 25 MG tablet Take 1 tablet (25 mg total) by mouth 2 (two) times daily. 06/09/14   Angiulli, Mcarthur Rossetti, PA-C  oxyCODONE (OXY IR/ROXICODONE) 5 MG immediate release tablet Take 1-3 tablets (5-15 mg total) by mouth every 4 (four) hours as needed for severe pain (5mg  for mild pain, 10mg  for moderate pain, 15mg  for severe pain). 06/09/14   Angiulli, , PA-C  prednisoLONE acetate (PRED FORTE) 1 % ophthalmic suspension Place 1 drop into the right eye 4 (four) times daily. 06/09/14   Angiulli, 06/11/14, PA-C      Allergies    Patient has no known  allergies.    Review of Systems   Review of Systems  Psychiatric/Behavioral:  Positive for suicidal ideas.    Physical Exam Updated Vital Signs BP (!) 140/98 (BP Location: Right Arm)    Pulse 95    Temp 98 F (36.7 C) (Oral)    Resp 18    Ht 5\' 9"  (1.753 m)    Wt 68 kg    SpO2 98%    BMI 22.15 kg/m  Physical Exam Vitals and nursing note reviewed.  Constitutional:      General: He is not in acute distress.    Appearance: He is well-developed.  HENT:     Head: Normocephalic and atraumatic.  Eyes:     Extraocular Movements: Extraocular movements intact.  Cardiovascular:     Rate and Rhythm: Normal rate.  Pulmonary:     Effort: Pulmonary effort is normal.  Abdominal:     General: There is no distension.  Musculoskeletal:        General: Normal range of motion.     Cervical back: Normal range of motion.  Skin:    General: Skin is warm.     Findings: No rash.  Neurological:     Mental Status: He is alert and oriented to person, place, and time.  Psychiatric:        Speech: Speech is rapid and pressured and tangential.  Comments: Flight of ideas.  Tangential thoughts.  Rapid and pressured speech.    ED Results / Procedures / Treatments   Labs (all labs ordered are listed, but only abnormal results are displayed) Labs Reviewed  RAPID URINE DRUG SCREEN, HOSP PERFORMED - Abnormal; Notable for the following components:      Result Value   Tetrahydrocannabinol POSITIVE (*)    All other components within normal limits  RESP PANEL BY RT-PCR (FLU A&B, COVID) ARPGX2  COMPREHENSIVE METABOLIC PANEL  CBC  ETHANOL  SALICYLATE LEVEL  ACETAMINOPHEN LEVEL    EKG None  Radiology No results found.  Procedures Procedures    Medications Ordered in ED Medications - No data to display  ED Course/ Medical Decision Making/ A&P                           Medical Decision Making  Patient presenting under IVC for psychiatric evaluation.  On exam, patient appears nontoxic.   Denying SI, HI, or AVH to me, however apparently reported suicidal thoughts in the presence of law enforcement.  On exam, patient has flight of ideas, tangential thoughts, rapid and pressured speech.  He is medically cleared for psychiatric evaluation.          Final Clinical Impression(s) / ED Diagnoses Final diagnoses:  None    Rx / DC Orders ED Discharge Orders     None         Alveria Apley, PA-C 12/16/21 1535    Pollyann Savoy, MD 12/20/21 [phone]

## 2021-12-12 NOTE — BH Assessment (Addendum)
@  2203, requested patient's nurse Levada Dy, RN) to place the TTS machine in patient's room for his initial TTS assessment.

## 2021-12-12 NOTE — ED Notes (Signed)
Pt changed into hospital scrubs. Belongings secured. Security called to wand pt.

## 2021-12-12 NOTE — ED Triage Notes (Signed)
Pt brought in by [facility] under IVC. Denies Suicidal Ideations, Homocidal ideations, Denies AVH. Pt was on speaker phone in presence of law enforcement and made suicidal statements. Girlfriend took out IVC. Pt denies previous attempts of suicide.

## 2021-12-13 DIAGNOSIS — F4320 Adjustment disorder, unspecified: Secondary | ICD-10-CM

## 2021-12-13 NOTE — ED Notes (Signed)
Returned pt belongings.

## 2021-12-13 NOTE — Consult Note (Addendum)
Telepsych Consultation   Reason for Consult:  Psychiatric Re-assessment Referring Physician:  Dr. Calvert Cantor Location of Patient:   [facility] Location of Provider: Other: virtual home office  Patient Identification: Phillip [name] MRN:  [id number] Principal Diagnosis: Adjustment disorder Diagnosis:  Principal Problem:   Adjustment disorder Active Problems:   TBI (traumatic brain injury)   Total Time spent with patient: 30 minutes  Subjective:   Phillip [name] is a 41 y.o. male patient admitted via IVC, petitioner is his girlfriend for suicidal ideations. Patient states today,"everything is okay and me and my girlfriend are good now."  Patient seen via telepsych by this provider; chart reviewed and consulted with Dr. Dwyane Dee on 12/13/21.  On evaluation Phillip [name] reports he's doing "good" today.  Sleep and appetite are good.  He is clear and coherent and able to properly communicate with this writer in normal speaking tone.  He's very talkative but is logical and goal oriented.  No concerns for psychosis.  He denies suicidal or homicidal ideations. States he may have over reacted making suicidal statement but states he didn't mean it.  States he has never tried to hurt himself in the past and has no intent on self harm now.  Lists his child as a strong protective factor against self harm. Additionally, "other women have cheated on me.  I'm used to it."  Patient is future oriented and relates his plan to return home and go back to work.  Patient states he spoke with his girlfriend yesterday and they worked things out.  States she admitted to infidelity but apologized for her actions. Patient states he has accepted her apology and they have agreed to not talk about the past and move forward.     HPI:  Per Provider Admission Assessment  Phillip [name] is a 41 y.o. male presenting under IVC for psychiatric evaluation.  Level 5 caveat due to psychiatric disorder.  Patient  states he is here because his girlfriend wanted him out of the house so she could cheat on him.  He denies SI, HI, or AVH.  Per triage note, patient was brought in by [facility] after girlfriend took out IVC paperwork.  Per triage note, patient was on speaker phone in the presence of law enforcement and made suicidal statements.   Past Psychiatric History: as outlined below  Risk to Self:  no Risk to Others:  no Prior Inpatient Therapy: no  Prior Outpatient Therapy:  no  Past Medical History: History reviewed. No pertinent past medical history.  Past Surgical History:  Procedure Laterality Date   DEBRIDEMENT AND CLOSURE WOUND Bilateral 05/25/2014   Procedure: SUPERFICIAL  CLOSURE OF WOUNDS AND DRESSING  ON RIGHT NECK, RIGHT LOWER LEG, LEFT FOREARM;  Surgeon: Adonis Brook, MD;  Location: [place];  Service: Ophthalmology;  Laterality: Bilateral;   INCISION AND DRAINAGE OF WOUND Right 05/26/2014   Procedure: IRRIGATION AND DEBRIDEMENT WOUND WITH WOUND CLOSURE;  Surgeon: Rozanna Box, MD;  Location: [place];  Service: Orthopedics;  Laterality: Right;   ORIF ACETABULAR FRACTURE Left 05/26/2014   Procedure: OPEN REDUCTION INTERNAL FIXATION (ORIF) ACETABULAR FRACTURE;  Surgeon: Rozanna Box, MD;  Location: [place];  Service: Orthopedics;  Laterality: Left;   RUPTURED GLOBE EXPLORATION AND REPAIR Right 05/25/2014   Procedure: REPAIR OF RUPTURED GLOBE WITH PROLAPSED UVEAL TISSUE RIGHT EYE;  Surgeon: Adonis Brook, MD;  Location: [place];  Service: Ophthalmology;  Laterality: Right;   Family History:  Family  History  Problem Relation Age of Onset   Other Father        substance abuse/suicide.    Stroke Mother    Family Psychiatric  History: unknown Social History:  Social History   Substance and Sexual Activity  Alcohol Use Yes   Comment: "the other day"     Social History   Substance and Sexual Activity  Drug Use Yes   Types: Marijuana    Social History    Socioeconomic History   Marital status: Single    Spouse name: Not on file   Number of children: Not on file   Years of education: Not on file   Highest education level: Not on file  Occupational History   Not on file  Tobacco Use   Smoking status: Every Day    Packs/day: 1.00    Types: Cigarettes   Smokeless tobacco: Not on file  Substance and Sexual Activity   Alcohol use: Yes    Comment: "the other day"   Drug use: Yes    Types: Marijuana   Sexual activity: Not on file  Other Topics Concern   Not on file  Social History Narrative   Not on file   Social Determinants of Health   Financial Resource Strain: Not on file  Food Insecurity: Not on file  Transportation Needs: Not on file  Physical Activity: Not on file  Stress: Not on file  Social Connections: Not on file   Additional Social History:    Allergies:  No Known Allergies  Labs:  Results for orders placed or performed during the hospital encounter of 12/12/21 (from the past 48 hour(s))  Rapid urine drug screen (hospital performed)     Status: Abnormal   Collection Time: 12/12/21  9:12 PM  Result Value Ref Range   Opiates NONE DETECTED NONE DETECTED   Cocaine NONE DETECTED NONE DETECTED   Benzodiazepines NONE DETECTED NONE DETECTED   Amphetamines NONE DETECTED NONE DETECTED   Tetrahydrocannabinol POSITIVE (A) NONE DETECTED   Barbiturates NONE DETECTED NONE DETECTED    Comment: (NOTE) DRUG SCREEN FOR MEDICAL PURPOSES ONLY.  IF CONFIRMATION IS NEEDED FOR ANY PURPOSE, NOTIFY LAB WITHIN 5 DAYS.  LOWEST DETECTABLE LIMITS FOR URINE DRUG SCREEN Drug Class                     Cutoff (ng/mL) Amphetamine and metabolites    1000 Barbiturate and metabolites    200 Benzodiazepine                 [id number] Tricyclics and metabolites     300 Opiates and metabolites        300 Cocaine and metabolites        300 THC                            50 Performed at [facility] [place], [place] [address]    Comprehensive metabolic panel     Status: None   Collection Time: 12/12/21  9:43 PM  Result Value Ref Range   Sodium 139 135 - 145 mmol/L   Potassium 4.2 3.5 - 5.1 mmol/L   Chloride 104 98 - 111 mmol/L   CO2 28 22 - 32 mmol/L   Glucose, Bld 90 70 - 99 mg/dL    Comment: Glucose reference range applies only to samples taken after fasting for at least 8 hours.   BUN 12 6 -  20 mg/dL   Creatinine, Ser 0.91 0.61 - 1.24 mg/dL   Calcium 9.0 8.9 - 10.3 mg/dL   Total Protein 6.9 6.5 - 8.1 g/dL   Albumin 4.0 3.5 - 5.0 g/dL   AST 26 15 - 41 U/L   ALT 14 0 - 44 U/L   Alkaline Phosphatase 41 38 - 126 U/L   Total Bilirubin 0.5 0.3 - 1.2 mg/dL   GFR, Estimated >60 >60 mL/min    Comment: (NOTE) Calculated using the CKD-EPI Creatinine Equation (2021)    Anion gap 7 5 - 15    Comment: Performed at [facility], [address], [place], [place] [address]  Ethanol     Status: None   Collection Time: 12/12/21  9:43 PM  Result Value Ref Range   Alcohol, Ethyl (B) <10 <10 mg/dL    Comment: (NOTE) Lowest detectable limit for serum alcohol is 10 mg/dL.  For medical purposes only. Performed at [facility], [address], [place], [place] [id number]   Salicylate level     Status: Abnormal   Collection Time: 12/12/21  9:43 PM  Result Value Ref Range   Salicylate Lvl Q000111Q (L) 7.0 - 30.0 mg/dL    Comment: Performed at [facility], [address] [place], [place] [address]  Acetaminophen level     Status: Abnormal   Collection Time: 12/12/21  9:43 PM  Result Value Ref Range   Acetaminophen (Tylenol), Serum <10 (L) 10 - 30 ug/mL    Comment: (NOTE) Therapeutic concentrations vary significantly. A range of 10-30 ug/mL  may be an effective concentration for many patients. However, some  are best treated at concentrations outside of this range. Acetaminophen concentrations >150 ug/mL at 4 hours after ingestion  and >50 ug/mL at 12 hours after ingestion are often associated with  toxic  reactions.  Performed at [facility], [address], [place], [place] [address]   cbc     Status: None   Collection Time: 12/12/21  9:43 PM  Result Value Ref Range   WBC 8.4 4.0 - 10.5 K/uL   RBC 4.34 4.22 - 5.81 MIL/uL   Hemoglobin 13.8 13.0 - 17.0 g/dL   HCT 40.8 39.0 - 52.0 %   MCV 94.0 80.0 - 100.0 fL   MCH 31.8 26.0 - 34.0 pg   MCHC 33.8 30.0 - 36.0 g/dL   RDW 13.4 11.5 - 15.5 %   Platelets 221 150 - 400 K/uL   nRBC 0.0 0.0 - 0.2 %    Comment: Performed at [facility], [address] [place], [place] [address]    Medications:  No current facility-administered medications for this encounter.   Current Outpatient Medications  Medication Sig Dispense Refill   aspirin 325 MG tablet Take 1 tablet (325 mg total) by mouth 2 (two) times daily. (Patient not taking: Reported on 12/13/2021)     gatifloxacin (ZYMAXID) 0.5 % SOLN Place 1 drop into the right eye 4 (four) times daily. (Patient not taking: Reported on 12/13/2021) 1 Bottle 1   methocarbamol (ROBAXIN) 500 MG tablet Take 1 tablet (500 mg total) by mouth every 6 (six) hours as needed for muscle spasms. (Patient not taking: Reported on 12/13/2021) 90 tablet 0   metoprolol tartrate (LOPRESSOR) 25 MG tablet Take 1 tablet (25 mg total) by mouth 2 (two) times daily. (Patient not taking: Reported on 12/13/2021) 60 tablet 0   oxyCODONE (OXY IR/ROXICODONE) 5 MG immediate release tablet Take 1-3 tablets (5-15 mg total) by mouth every 4 (  four) hours as needed for severe pain (5mg  for mild pain, 10mg  for moderate pain, 15mg  for severe pain). (Patient not taking: Reported on 12/13/2021) 90 tablet 0   prednisoLONE acetate (PRED FORTE) 1 % ophthalmic suspension Place 1 drop into the right eye 4 (four) times daily. (Patient not taking: Reported on 12/13/2021) 5 mL 1    Musculoskeletal: Strength & Muscle Tone: within normal limits Gait & Station: normal Patient leans: N/A   Psychiatric Specialty Exam:  Presentation  General Appearance:  Casual  Eye Contact:Good  Speech:Clear and Coherent; Normal Rate  Speech Volume:Normal  Handedness:Right   Mood and Affect  Mood:Euthymic  Affect:Appropriate; Congruent   Thought Process  Thought Processes:Coherent; Goal Directed  Descriptions of Associations:Circumstantial  Orientation:Full (Time, Place and Person)  Thought Content:Logical  History of Schizophrenia/Schizoaffective disorder:No  Duration of Psychotic Symptoms:No data recorded Hallucinations:Hallucinations: None  Ideas of Reference:None  Suicidal Thoughts:Suicidal Thoughts: No  Homicidal Thoughts:Homicidal Thoughts: No   Sensorium  Memory:Immediate Good; Recent Good; Remote Good  Judgment:Good  Insight:Fair   Executive Functions  Concentration:Good  Attention Span:Good  [place] of Knowledge:Good  Language:Good   Psychomotor Activity  Psychomotor Activity:Psychomotor Activity: Normal   Assets  Assets:Communication Skills; Desire for Improvement; Housing   Sleep  Sleep:Sleep: Good Number of Hours of Sleep: 8    Physical Exam: Physical Exam Constitutional:      Appearance: Normal appearance.  Cardiovascular:     Rate and Rhythm: Normal rate.     Pulses: Normal pulses.  Pulmonary:     Effort: Pulmonary effort is normal.  Musculoskeletal:        General: Normal range of motion.     Cervical back: Normal range of motion.  Neurological:     General: No focal deficit present.     Mental Status: He is alert and oriented to person, place, and time.  Psychiatric:        Attention and Perception: Attention normal.        Mood and Affect: Mood normal.        Speech: Speech normal.        Behavior: Behavior normal.        Thought Content: Thought content normal. Thought content is not paranoid or delusional. Thought content does not include homicidal or suicidal ideation. Thought content does not include homicidal or suicidal plan.        Cognition and Memory:  Cognition and memory normal.        Judgment: Judgment normal.   Review of Systems  Constitutional: Negative.   HENT: Negative.    Eyes: Negative.   Respiratory: Negative.    Cardiovascular: Negative.   Gastrointestinal: Negative.   Genitourinary: Negative.   Musculoskeletal: Negative.   Skin: Negative.   Neurological: Negative.   Endo/Heme/Allergies: Negative.   Psychiatric/Behavioral: Negative.  Negative for depression, hallucinations and suicidal ideas.   Blood pressure 140/83, pulse 71, temperature 98.9 F (37.2 C), temperature source Oral, resp. rate 16, height 5\' 9"  (1.753 m), weight 68 kg, SpO2 98 %. Body mass index is 22.15 kg/m.  Treatment Plan Summary: Plan- As per above assessment, there are no current grounds for involuntary commitment at this time.  There are no safety concerns today. Patient is future oriented and relates his plan to return home to go to work.  Asking SW to provide resources for outpatient counseling.  Patient is not currently interested in inpatient services but expresses agreement to continue outpatient treatment., we have reviewed importance of substance abuse abstinence,  potential negative impact substance abuse can have on his relationships and level of functioning, and importance of medication compliance.  Disposition: No evidence of imminent risk to self or others at present.   Patient does not meet criteria for psychiatric inpatient admission. Supportive therapy provided about ongoing stressors. Discussed crisis plan, support from social network, calling 911, coming to the Emergency Department, and calling Suicide Hotline.  This service was provided via telemedicine using a 2-way, interactive audio and video technology.  Names of all persons participating in this telemedicine service and their role in this encounter. Name: Phillip [name] Role: Patient  Name: Merlyn Lot Role: PMHNP    Mallie Darting, NP 12/13/2021 12:59 PM

## 2021-12-13 NOTE — ED Notes (Addendum)
Pt psych cleared per Ophelia Shoulder, NP.

## 2021-12-13 NOTE — Discharge Instructions (Signed)
Patient has been recommended to continue mental health outpatient services within his community. Patient has been encouraged to secure those services with one of the following agencies:   [facility]  [address] [place], [place] [address] [phone]  [facility]  [address], [place], [place] [address] [phone]  [facility]  [address] [place], [place], [place] [address] [phone]

## 2021-12-13 NOTE — ED Provider Notes (Signed)
Emergency Medicine Observation Re-evaluation Note  Phillip [name] is a 41 y.o. male, seen on rounds today.  Pt initially presented to the ED for complaints of Medical Clearance Currently, the patient is resting quietly.  Physical Exam  BP (!) 141/86 (BP Location: Left Arm)    Pulse 73    Temp 97.7 F (36.5 C)    Resp 18    Ht 5\' 9"  (1.753 m)    Wt 68 kg    SpO2 98%    BMI 22.15 kg/m  Physical Exam General: No acute distress Cardiac: Well perfused Lungs: Nonlabored Psych: Cooperative  ED Course / MDM  EKG:   I have reviewed the labs performed to date as well as medications administered while in observation.  Recent changes in the last 24 hours include psychiatric evaluation.  Plan  Current plan is for reassessment in a.m. LEVERETT CAMPLIN is under involuntary commitment.   Inform patient has been psychiatrically cleared.  Patient's IVC was provoked by me.  Discharge instructions given.   Sherald Barge, MD 12/13/21 02/10/22
